# Patient Record
Sex: Female | Born: 1960
Health system: Southern US, Community
[De-identification: ages and names within clinical notes are randomized; demographics above are authoritative.]

## PROBLEM LIST (undated history)

## (undated) DIAGNOSIS — R011 Cardiac murmur, unspecified: Secondary | ICD-10-CM

## (undated) DIAGNOSIS — I10 Essential (primary) hypertension: Secondary | ICD-10-CM

## (undated) DIAGNOSIS — M722 Plantar fascial fibromatosis: Secondary | ICD-10-CM

## (undated) DIAGNOSIS — D649 Anemia, unspecified: Secondary | ICD-10-CM

## (undated) HISTORY — DX: Essential (primary) hypertension: I10

## (undated) HISTORY — PX: ABDOMINAL HYSTERECTOMY: SHX81

## (undated) SURGERY — Surgical Case
Anesthesia: *Unknown

---

## 1999-10-17 ENCOUNTER — Other Ambulatory Visit: Admission: RE | Admit: 1999-10-17 | Discharge: 1999-10-17 | Payer: Self-pay | Admitting: Obstetrics and Gynecology

## 2002-08-19 ENCOUNTER — Other Ambulatory Visit: Admission: RE | Admit: 2002-08-19 | Discharge: 2002-08-19 | Payer: Self-pay | Admitting: Obstetrics and Gynecology

## 2005-07-01 ENCOUNTER — Emergency Department (HOSPITAL_COMMUNITY): Admission: EM | Admit: 2005-07-01 | Discharge: 2005-07-02 | Payer: Self-pay | Admitting: Emergency Medicine

## 2007-01-09 ENCOUNTER — Ambulatory Visit (HOSPITAL_COMMUNITY): Admission: RE | Admit: 2007-01-09 | Discharge: 2007-01-09 | Payer: Self-pay | Admitting: Obstetrics and Gynecology

## 2007-07-04 ENCOUNTER — Inpatient Hospital Stay (HOSPITAL_COMMUNITY): Admission: EM | Admit: 2007-07-04 | Discharge: 2007-07-06 | Payer: Self-pay | Admitting: *Deleted

## 2007-08-03 ENCOUNTER — Inpatient Hospital Stay (HOSPITAL_COMMUNITY): Admission: AD | Admit: 2007-08-03 | Discharge: 2007-08-03 | Payer: Self-pay | Admitting: Obstetrics and Gynecology

## 2008-03-08 ENCOUNTER — Emergency Department (HOSPITAL_COMMUNITY): Admission: EM | Admit: 2008-03-08 | Discharge: 2008-03-08 | Payer: Self-pay | Admitting: Emergency Medicine

## 2008-11-21 IMAGING — CR DG CHEST 2V
3 series · 3 of 3 positions shown · non-contrast
Comparison: none

CLINICAL DATA: Fainting and shortness of breath. 
 CHEST - 2 VIEW:

[w chest pa *]
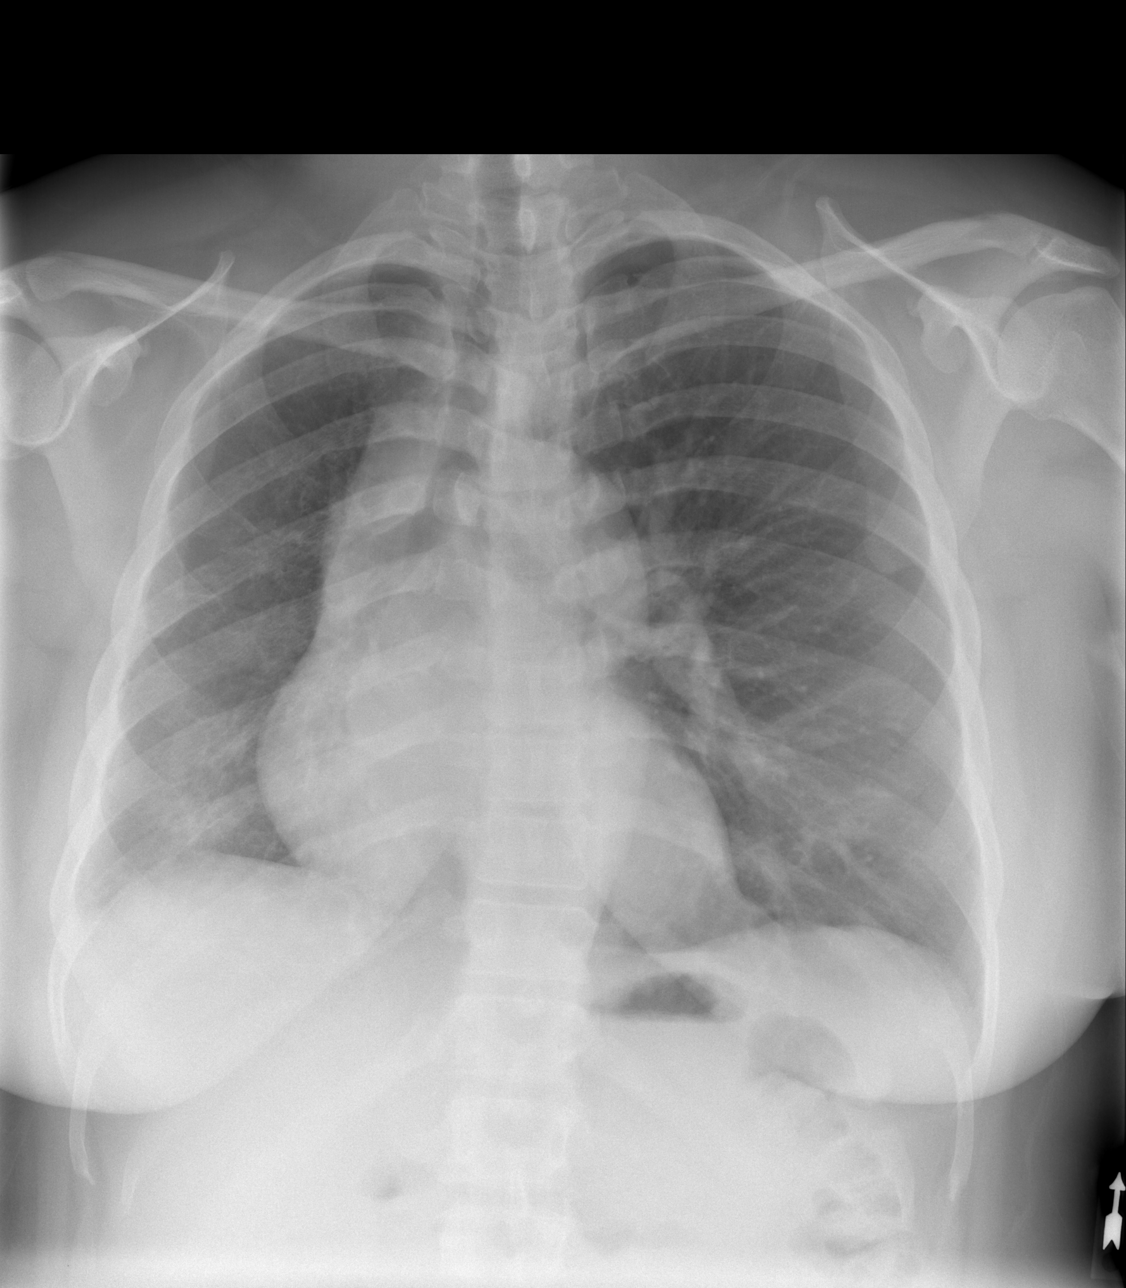

[w chest pa]
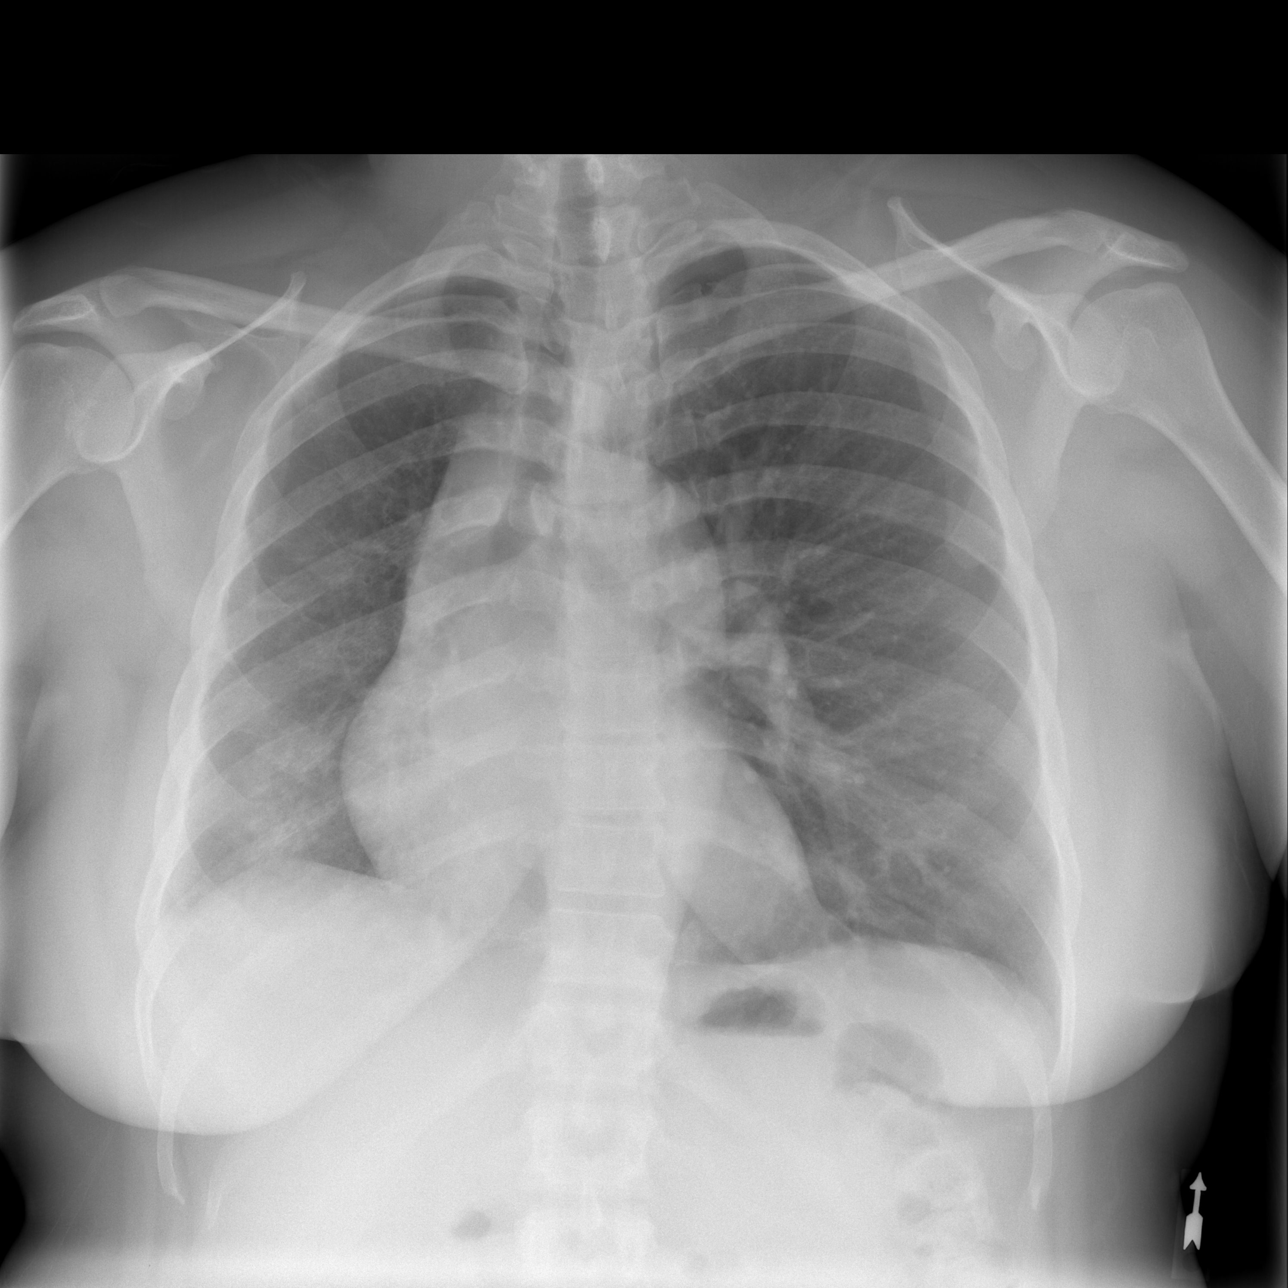

[w chest lat]
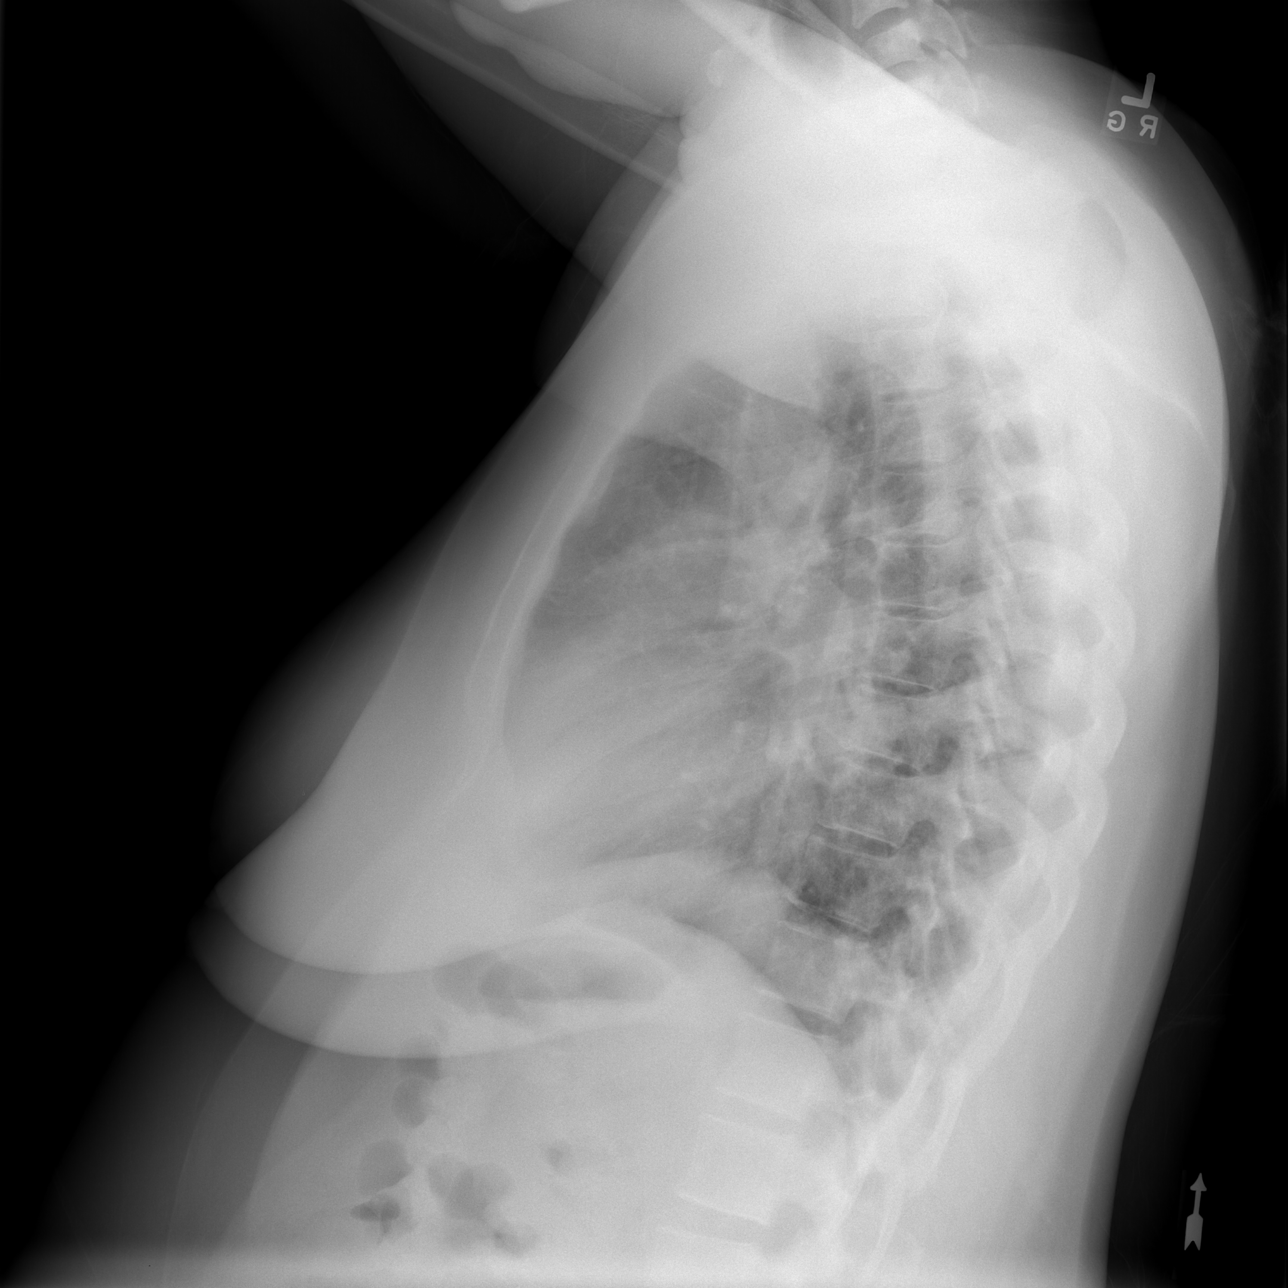

[3 of 3 positions shown; findings below may reference images not displayed]

FINDINGS: Normal mediastinum and cardiac silhouette with ectatic aorta. There is vague airspace disease projecting over the right hemidiaphragm. Left costophrenic angle is clear. There is mild prominence of the central pulmonary vasculature. No evidence of effusion. No pneumothorax.
IMPRESSION: 1.  Vague airspace disease in the right lower lung likely represents early pneumonia. 
 2.  Mild central venous pulmonary congestion.

## 2011-02-13 NOTE — H&P (Signed)
[name], Montgomery NO.:  [id number]   MEDICAL RECORD NO.:  [id number]          PATIENT TYPE:  MAT   LOCATION:  [facility]                          FACILITY:  [facility]   PHYSICIAN:  [name], M.D.DATE OF BIRTH:  1960-12-28   DATE OF ADMISSION:  08/03/2007  DATE OF DISCHARGE:                              HISTORY & PHYSICAL   HISTORY OF PRESENT ILLNESS:  [name] is a 50 year old female,  gravida 3, para 3-0-0-4, who presents to the maternity admissions area  at the [facility] complaining of vaginal bleeding.  The patient has a history of severe anemia, fibroids, and menorrhagia.  The patient was admitted in October 2008 and transfused because of  severe anemia and menorrhagia.  She is currently taking 3 birth control  pills each day to control her bleeding.  She reports that her bleeding  became slightly heavier on August 01, 2007.  She was frightened by the  bleeding and presented for evaluation.  She is currently scheduled for a  vaginal hysterectomy later this month.  The patient has a long history  of anemia, with a hemoglobin as low as five.  Her most recent hemoglobin  was 8.4.   PAST MEDICAL HISTORY:  1. The patient has a history of asthma.  2. She also has a history of one prior cesarean section.   CURRENT MEDICATIONS:  Iron, folic acid, multivitamins, and 3 oral  contraceptives each day.   DRUG ALLERGIES:  NO KNOWN DRUG ALLERGIES.   SOCIAL HISTORY:  The patient stopped smoking recently.  She drinks  alcohol socially.  She denies other recreational drug uses.   REVIEW OF SYSTEMS:  The patient denies dizziness at this point.   FAMILY HISTORY:  Her mother has hypertension, and her father has  diabetes.   PHYSICAL EXAMINATION:  VITAL SIGNS:  Blood pressure is 124/61, pulse is  85, respirations 20, temperature is 98.1.  HEENT:  Within normal limits.  CHEST:  Clear.  HEART:  Regular rate and rhythm.  ABDOMEN:  Soft and  nontender.  EXTREMITIES:  Grossly normal.  NEUROLOGIC:  Grossly normal.  PELVIC:  External genitalia is normal.  Vagina has only a small amount  of blood present.  The cervix is nontender.  There is a small amount of  blood present in the cervix.  The uterus is approximately 12-14 weeks  size and irregular.  Adnexa:  No masses are appreciated.   LABORATORY VALUES:  White blood cell count is 10,000, hemoglobin is 8.6,  hematocrit is 27.2, platelet count is 547.  Pregnancy test is negative.  Urinalysis is negative, except for a large amount of blood.  Her  chemistries are within normal limits.   ASSESSMENT:  1. History of menorrhagia.  2. History of anemia.  3. Fibroid uterus.   PLAN:  The patient is currently stable, and I do not see any reason that  we need to perform the patient's vaginal hysterectomy on an emergent  basis.  She will continue her 3 birth control pills each day, and she  will call should her bleeding increase.  We will continue with our plans  for surgery in November 2008, and we will move that surgery up if there  becomes an emergent reason to do so.      [name], M.D.  Electronically Signed     AVS/MEDQ  D:  08/03/2007  T:  08/04/2007  Job:  [id number]

## 2011-02-13 NOTE — Consult Note (Signed)
[name], [name]              ACCOUNT NO.:  [id number]   MEDICAL RECORD NO.:  [id number]          PATIENT TYPE:  INP   LOCATION:  1425                         FACILITY:  [facility]   PHYSICIAN:  Naima A. Dillard, M.D. DATE OF BIRTH:  Aug 06, 1961   DATE OF CONSULTATION:  07/05/2007  DATE OF DISCHARGE:                                 CONSULTATION   TIME SEEN:  2:55 p.m.   REASON FOR CONSULTATION:  Irregular vaginal bleeding and anemia.   FINDINGS:  The patient is a 50 year old, African-American female,  gravida 3, para 4, who presented with anemia and vaginal bleeding.  She  has had vaginal bleeding since May 27, 2007, where she has been  soaking through eight pads a day.  Her vaginal bleeding did decrease  with IV Estrogen given here in the hospital.  She had a hemoglobin of 5,  was transfused two units, which it did go up to 8.  She now denies any  shortness of breath, chest pain, or headaches.  She does still have a  scant vaginal bleeding.  She says that she has had heavy periods every  month lasting for seven days for over a year.  She has been seen in our  office by Dr. Stefano Gaul and has had an ultrasound and endometrial biopsy  and was preparing for a vaginal hysterectomy but lost her insurance, and  patient did not follow up with the [facility] Department or our office as  till this time.   PAST MEDICAL HISTORY:  Asthma.   PAST SURGICAL HISTORY:  C-section x1.   MEDICATIONS:  Zithromax, Rocephin, and Estrogen.   SOCIAL HISTORY:  She quit tobacco about six days ago, she drinks  moderate alcohol, and no illicit drug use.   FAMILY HISTORY:  She has no GYN cancer; her mother with chronic  hypertension and her dad with diabetes.   REVIEW OF SYSTEMS:  RESPIRATORY:  Significant for asthma and pneumonia.  GENITOURINARY:  Significant for menometrorrhagia.  PSYCHIATRIC:  Unremarkable.  ENDOCRINE:  Unremarkable.   PHYSICAL EXAMINATION:  VITAL SIGNS:  The patient is afebrile with  stable  vital signs.  HEART:  Regular rate and rhythm.  LUNGS:  Clear to auscultation bilaterally.  ABDOMEN:  Nondistended, soft and nontender, with no rebound tenderness  or guarding.  GENITOURINARY:  Scant vaginal bleeding, no cervical or vaginal lesions,  vulva was also within normal limits, uterus was about 10 to 12 weeks  size and irregular with no adnexal masses palpated bilaterally.  EXTREMITIES:  The legs have no cyanosis, clubbing, or edema.   PERTINENT LABORATORY DATA:  Her hemoglobin was 8.2.  Her urine pregnancy  test was negative.   ASSESSMENT:  1. Menometrorrhagia.  2. Anemia.  3. Uterine fibroids.  4. Pneumonia.   PLAN:  I agree with the IV Estrogen every four hours, but it should be  stopped once the bleeding stops.  The patient should be started on a low  dose birth control pill such as Alesse or LoEstrin 24 one tablet each  day.  The patient can call our office at [phone] or [facility]  at  [phone] for an appointment as soon as possible to get the patient ready  for a hysterectomy if this is what she so chooses.  The patient may have  to go to [facility] because she does not have insurance, and they  will allow her to pay on a sliding scale.  I also recommend a Social  Work consult till she is able to have help paying for her healthcare.  I  recommend iron sulfate 325 mg twice a day with Colace as needed.  Would  only recommend a hysterectomy once her hemoglobin has stabilized and her  pneumonia resolves.  Patient was offered Lupron, which she has had in  the past and decided to decline it today because of the side effects.  All treatments for fibroids were reviewed in detail, which are but not  limited to observation, all different forms of birth control, Lupron,  myomectomy, uterine embolization, and a hysterectomy.  The patient  states that she wants to do a hysterectomy.  She understands that there  is a small risk of developing a DVT or PE on the  birth control pills,  but she states that she will not smoke and call with any shortness of  breath, chest pain, or leg pain or swelling.      Naima A. Normand Sloop, M.D.  Electronically Signed     NAD/MEDQ  D:  07/05/2007  T:  07/05/2007  Job:  [id number]

## 2011-02-13 NOTE — Discharge Summary (Signed)
NAMEROBERTO, HLAVATY              ACCOUNT NO.:  [id number]   MEDICAL RECORD NO.:  [id number]          PATIENT TYPE:  INP   LOCATION:  1425                         FACILITY:  [facility]   PHYSICIAN:  Beckey Rutter, MD  DATE OF BIRTH:  27-Oct-1960   DATE OF ADMISSION:  07/04/2007  DATE OF DISCHARGE:  07/06/2007                               DISCHARGE SUMMARY   CHIEF COMPLAINT OF PRESENTATION:  Shortness of breath.   HISTORY OF PRESENT ILLNESS:  This is a pleasant 50 year old female with  a history of uterine fibroids admitted for acute blood loss anemia which  was symptomatic.  During hospitalization, the patient given 3 units of  packed RBCs and she was given a steroid injection to help stop the  uterine bleeding.  Her uterine/vaginal bleeding now stopped and her  hemoglobin is stabilized.  Her last hemoglobin is 9.2.   During her hospitalization she was seen also by GYN in consultation by  Dr. Jaymes Graff who recommended the patient to start on low dose of  oral contraceptive pills especially now she stated she quit smoking.  The patient was supposed to follow up with Dr. Redmond Baseman group with the  phone number [phone] or [facility] at [phone].   The patient will be seen by social work before discharge to help  reevaluate her insurance problem as per family request.   DISCHARGE DIAGNOSES:  1. Acute blood loss anemia requiring 3 units of transfusion.  2. Excessive vaginal bleeding.  3. Uterine fibroid.  4. Evidence of pneumonia.   DISCHARGE MEDICATIONS:  1. Iron sulfate 325 mg t.i.d.  2. Folic acid 1 mg p.o. daily.  3. Alesse oral contraceptive pills daily as directed.  4. Zithromax 500 mg p.o. for 3 more days.   DISCHARGE/PLAN:  1. For her pneumonia, the patient will continue on her Zithromax.  2. For the uterine bleeding/vaginal bleeding, she will be continued on      oral contraceptive pill and follow up GYN as discussed with her.   CONSULTATION:  The patient was  seen in consultation by Dr. Normand Sloop.     Beckey Rutter, MD  Electronically Signed    EME/MEDQ  D:  07/06/2007  T:  07/07/2007  Job:  [id number]

## 2011-02-13 NOTE — H&P (Signed)
[name], [name]              ACCOUNT NO.:  [id number]   MEDICAL RECORD NO.:  [id number]          PATIENT TYPE:  INP   LOCATION:  1425                         FACILITY:  [facility]   PHYSICIAN:  Della Goo, M.D. DATE OF BIRTH:  [birth year]   DATE OF ADMISSION:  07/04/2007  DATE OF DISCHARGE:                              HISTORY & PHYSICAL   PRIMARY CARE PHYSICIAN:  Unassigned.   HISTORY OF PRESENT ILLNESS:  This is a 50 year old female who presented  to the emergency department secondary to severe weakness and continued  vaginal bleeding which she reports having for approximately 2 months.  She has a history of fibroids and had undergone an evaluation for  possible hysterectomy, however, reports losing her insurance and did not  continue her followup.  The patient reports having symptoms of increased  weakness and presyncope for approximately 1 week and she reports that  these symptoms were worse today.  She reports being short of breath, but  denies having chest pain.  She denies having any fevers or chills.  In  the emergency department, the patient had lab work performed, results of  which reveal a hemoglobin level of 6.1 and the patient was referred for  admission.   PAST MEDICAL HISTORY:  History of asthma.   PAST SURGICAL HISTORY:  C-section x1.   MEDICATIONS:  None.   ALLERGIES:  NO KNOWN DRUG ALLERGIES.   SOCIAL HISTORY:  The patient is married.  History of tobacco usage and  history of occasional alcohol usage.  The patient denies having any  illicit drug usage.   FAMILY HISTORY:  Positive for hypertension in her mother.  Positive for  type 2 diabetes in her father.   REVIEW OF SYSTEMS:  Pertinents are mentioned above.   PHYSICAL EXAMINATION:  GENERAL:  This is an obese 50 year old female  with stable vital signs, who is in no acute distress, but is in  discomfort.  VITAL SIGNS:  Temperature 98, blood pressure 135/73, heart  rate 90, respirations 20, O2  saturations 100% on room air.  HEENT:  Normocephalic, atraumatic.  There is no scleral icterus.  Pupils are  equally round and reactive to light.  Extraocular muscles are intact.  Funduscopic benign.  Oropharynx is clear.  NECK:  Supple.  Full range of motion.  No thyromegaly, adenopathy or  jugular venous distention.  CARDIOVASCULAR:  Regular rate and rhythm.  No murmurs, gallops or rubs.  LUNGS:  Clear to auscultation bilaterally.  ABDOMEN:  Positive bowel sounds.  Soft, nontender, nondistended.  EXTREMITIES:  Without cyanosis or clubbing.  There is trace edema.  NEUROLOGIC:  The patient does have mild generalized weakness.  She is  alert and oriented x3.  There are no focal deficits.   LABORATORY STUDIES:  White count 7.6, hemoglobin 6.1, hematocrit 20.3,  platelets 503,000, neutrophils 51%, lymphocytes 37%.  Sodium 138,  potassium 3.6, chloride 106, CO2 26, BUN 9, creatinine 0.68 and glucose  89.  Urine pregnancy test negative.  Urine drug screen negative.  Urinalysis negative.  Pro time 13, INR 1, PTT 28.   Chest x-ray findings revealed  vague airspace disease and right lower  lung/early pneumonia and mild central venous pulmonary congestion.   Review of the medical records and radiologic reports reveals the patient  underwent a CT scan of the pelvis, October 2006, which did reveal an  enlarged fibroid uterus.   ASSESSMENT:  A 50 year old female being admitted with:  1. Anemia.  2. Vaginal bleeding.  3. Uterine fibroids.  4. Pneumonia, right lower lobe.  5. Obesity.  6. Tobacco abuse.   PLAN:  The patient will be admitted and will be transfused 2 units of  packed red blood cells.  Her hemoglobin levels will be monitored.  One  unit of packed red cells will be placed on hold in case of a need for  further transfusion.  IV fluids have been ordered as well.  The patient  will be placed on therapy for the vaginal bleeding with IV estrogen.  A  GYN consultation will be placed.   The patient also will receive  antibiotic therapy for pneumonia of Rocephin and azithromycin.  The  patient will also be placed on nebulizer treatments and supplemental  oxygen p.r.n.  The patient will be placed on GI prophylaxis and TED hose  will be ordered for DVT prophylaxis      Della Goo, M.D.  Electronically Signed     HJ/MEDQ  D:  07/05/2007  T:  07/07/2007  Job:  [id number]   cc:   Janine Limbo, M.D.  Fax: [phone]

## 2011-07-10 LAB — BASIC METABOLIC PANEL
BUN: 5 — ABNORMAL LOW
CO2: 27
Calcium: 8.5
Chloride: 106
Creatinine, Ser: 0.64
GFR calc Af Amer: >60
GFR calc non Af Amer: >60
Glucose, Bld: 131 — ABNORMAL HIGH
Potassium: 3.3 — ABNORMAL LOW
Sodium: 137

## 2011-07-10 LAB — URINALYSIS, ROUTINE W REFLEX MICROSCOPIC
Glucose, UA: NEGATIVE
Ketones, ur: NEGATIVE
Leukocytes, UA: NEGATIVE
Nitrite: NEGATIVE
Protein, ur: 30 — AB
Specific Gravity, Urine: >1.03 — ABNORMAL HIGH
Urobilinogen, UA: 0.2
pH: 6

## 2011-07-10 LAB — CBC
HCT: 27.2 — ABNORMAL LOW
Hemoglobin: 8.6 — ABNORMAL LOW
MCHC: 31.6
MCV: 67.6 — ABNORMAL LOW
Platelets: 547 — ABNORMAL HIGH
RBC: 4.03
RDW: 24.5 — ABNORMAL HIGH
WBC: 10

## 2011-07-10 LAB — POCT PREGNANCY, URINE
Operator id: 11741
Operator id: 117411
Preg Test, Ur: NEGATIVE
Preg Test, Ur: NEGATIVE

## 2011-07-10 LAB — TYPE AND SCREEN
ABO/RH(D): O POS
Antibody Screen: NEGATIVE

## 2011-07-10 LAB — URINE MICROSCOPIC-ADD ON

## 2011-07-12 LAB — DIFFERENTIAL
Basophils Absolute: 0.1
Basophils Relative: 1
Eosinophils Absolute: 0.2
Eosinophils Relative: 3
Lymphocytes Relative: 37
Lymphs Abs: 2.8
Monocytes Absolute: 0.6
Monocytes Relative: 8
Neutro Abs: 3.9
Neutrophils Relative %: 51

## 2011-07-12 LAB — BASIC METABOLIC PANEL
BUN: 6
Creatinine, Ser: 0.67
GFR calc non Af Amer: >60

## 2011-07-12 LAB — CBC
HCT: 20.3 — ABNORMAL LOW
Hemoglobin: 6.1 — CL
MCHC: 30
MCV: 65 — ABNORMAL LOW
Platelets: 503 — ABNORMAL HIGH
RBC: 3.12 — ABNORMAL LOW
RDW: 20.9 — ABNORMAL HIGH
WBC: 7.6

## 2011-07-12 LAB — CROSSMATCH
ABO/RH(D): O POS
Antibody Screen: NEGATIVE

## 2011-07-12 LAB — TSH: TSH: 4.222

## 2011-07-12 LAB — URINALYSIS, ROUTINE W REFLEX MICROSCOPIC
Bilirubin Urine: NEGATIVE
Glucose, UA: NEGATIVE
Hgb urine dipstick: NEGATIVE
Ketones, ur: NEGATIVE
Nitrite: NEGATIVE
Protein, ur: NEGATIVE
Specific Gravity, Urine: 1.021
Urobilinogen, UA: 0.2
pH: 6

## 2011-07-12 LAB — PROTIME-INR
INR: 1
Prothrombin Time: 13

## 2011-07-12 LAB — COMPREHENSIVE METABOLIC PANEL
ALT: 16
AST: 18
Albumin: 3 — ABNORMAL LOW
Alkaline Phosphatase: 63
BUN: 9
CO2: 26
Calcium: 8.8
Chloride: 106
Creatinine, Ser: 0.68
GFR calc Af Amer: >60
GFR calc non Af Amer: >60
Glucose, Bld: 89
Potassium: 3.6
Sodium: 138
Total Bilirubin: 0.4
Total Protein: 6.8

## 2011-07-12 LAB — CULTURE, BLOOD (ROUTINE X 2)
Culture: NO GROWTH
Culture: NO GROWTH

## 2011-07-12 LAB — APTT: aPTT: 28

## 2011-07-12 LAB — RAPID URINE DRUG SCREEN, HOSP PERFORMED
Amphetamines: NOT DETECTED
Barbiturates: NOT DETECTED
Benzodiazepines: NOT DETECTED
Cocaine: NOT DETECTED
Opiates: NOT DETECTED
Tetrahydrocannabinol: NOT DETECTED

## 2011-07-12 LAB — HEMOGLOBIN AND HEMATOCRIT, BLOOD
HCT: 17.4 — ABNORMAL LOW
HCT: 28.5 — ABNORMAL LOW
Hemoglobin: 5.3 — CL
Hemoglobin: 8.9 — ABNORMAL LOW
Hemoglobin: 9.1 — ABNORMAL LOW
Hemoglobin: 9.2 — ABNORMAL LOW

## 2011-07-12 LAB — FERRITIN: Ferritin: 11 (ref 10–291)

## 2011-07-12 LAB — IRON AND TIBC
Iron: 18 — ABNORMAL LOW
UIBC: 372

## 2011-07-12 LAB — ABO/RH: ABO/RH(D): O POS

## 2011-07-12 LAB — PREGNANCY, URINE: Preg Test, Ur: NEGATIVE

## 2011-07-12 LAB — RETICULOCYTES: RBC.: 3.61 — ABNORMAL LOW

## 2011-07-12 LAB — PREPARE RBC (CROSSMATCH)

## 2011-08-08 ENCOUNTER — Emergency Department (HOSPITAL_COMMUNITY)
Admission: EM | Admit: 2011-08-08 | Discharge: 2011-08-08 | Discharge status: Against Medical Advice | Payer: Self-pay | Attending: Emergency Medicine | Admitting: Emergency Medicine

## 2011-08-08 DIAGNOSIS — R040 Epistaxis: Secondary | ICD-10-CM | POA: Insufficient documentation

## 2012-02-24 ENCOUNTER — Emergency Department (HOSPITAL_COMMUNITY)
Admission: EM | Admit: 2012-02-24 | Discharge: 2012-02-25 | Disposition: A | Discharge status: Home / Self Care | Payer: Self-pay | Attending: Emergency Medicine | Admitting: Emergency Medicine

## 2012-02-24 DIAGNOSIS — R55 Syncope and collapse: Secondary | ICD-10-CM | POA: Insufficient documentation

## 2012-02-24 DIAGNOSIS — R42 Dizziness and giddiness: Secondary | ICD-10-CM | POA: Insufficient documentation

## 2012-02-24 DIAGNOSIS — D500 Iron deficiency anemia secondary to blood loss (chronic): Secondary | ICD-10-CM | POA: Insufficient documentation

## 2012-02-24 DIAGNOSIS — N898 Other specified noninflammatory disorders of vagina: Secondary | ICD-10-CM | POA: Insufficient documentation

## 2012-02-24 HISTORY — DX: Anemia, unspecified: D64.9

## 2012-02-24 LAB — POCT I-STAT, CHEM 8
BUN: 5 mg/dL — ABNORMAL LOW (ref 6–23)
Calcium, Ion: 1.26 mmol/L (ref 1.12–1.32)
Chloride: 108 mEq/L (ref 96–112)
Creatinine, Ser: 0.8 mg/dL (ref 0.50–1.10)
Glucose, Bld: 97 mg/dL (ref 70–99)
HCT: 22 % — ABNORMAL LOW (ref 36.0–46.0)
Hemoglobin: 7.5 g/dL — ABNORMAL LOW (ref 12.0–15.0)
Potassium: 3.9 mEq/L (ref 3.5–5.1)
Sodium: 143 mEq/L (ref 135–145)
TCO2: 24 mmol/L (ref 0–100)

## 2012-02-24 LAB — DIFFERENTIAL
Basophils Absolute: 0.1 10*3/uL (ref 0.0–0.1)
Basophils Relative: 1 % (ref 0–1)
Eosinophils Absolute: 0.3 10*3/uL (ref 0.0–0.7)
Eosinophils Relative: 4 % (ref 0–5)
Lymphocytes Relative: 29 % (ref 12–46)
Lymphs Abs: 2.4 10*3/uL (ref 0.7–4.0)
Monocytes Absolute: 0.8 10*3/uL (ref 0.1–1.0)
Monocytes Relative: 9 % (ref 3–12)
Neutro Abs: 4.8 10*3/uL (ref 1.7–7.7)
Neutrophils Relative %: 57 % (ref 43–77)

## 2012-02-24 LAB — COMPREHENSIVE METABOLIC PANEL
ALT: 12 U/L (ref 0–35)
AST: 15 U/L (ref 0–37)
Albumin: 3.1 g/dL — ABNORMAL LOW (ref 3.5–5.2)
Alkaline Phosphatase: 58 U/L (ref 39–117)
BUN: 7 mg/dL (ref 6–23)
CO2: 23 mEq/L (ref 19–32)
Calcium: 9 mg/dL (ref 8.4–10.5)
Chloride: 106 mEq/L (ref 96–112)
Creatinine, Ser: 0.83 mg/dL (ref 0.50–1.10)
GFR calc Af Amer: >90 mL/min (ref >90)
GFR calc non Af Amer: 80 mL/min — ABNORMAL LOW (ref >90)
Glucose, Bld: 95 mg/dL (ref 70–99)
Potassium: 3.8 mEq/L (ref 3.5–5.1)
Sodium: 138 mEq/L (ref 135–145)
Total Bilirubin: 0.1 mg/dL — ABNORMAL LOW (ref 0.3–1.2)
Total Protein: 7 g/dL (ref 6.0–8.3)

## 2012-02-24 LAB — CBC
HCT: 21.2 % — ABNORMAL LOW (ref 36.0–46.0)
Hemoglobin: 5.6 g/dL — CL (ref 12.0–15.0)
MCH: 17.2 pg — ABNORMAL LOW (ref 26.0–34.0)
MCHC: 26.5 g/dL — ABNORMAL LOW (ref 30.0–36.0)
MCV: 64.7 fL — ABNORMAL LOW (ref 78.0–100.0)
Platelets: 543 10*3/uL — ABNORMAL HIGH (ref 150–400)
RBC: 3.26 MIL/uL — ABNORMAL LOW (ref 3.87–5.11)
RDW: 21.2 % — ABNORMAL HIGH (ref 11.5–15.5)
WBC: 8.4 10*3/uL (ref 4.0–10.5)

## 2012-02-24 LAB — PREPARE RBC (CROSSMATCH)

## 2012-02-24 MED ORDER — SODIUM CHLORIDE 0.9 % IV BOLUS (SEPSIS): 1000.0000 mL | Freq: Once | INTRAVENOUS | Status: DC

## 2012-02-24 MED ORDER — SODIUM CHLORIDE 0.9 % IV SOLN
1000.0000 mL | INTRAVENOUS | Status: DC
  Administered 2012-02-24: 1000 mL via INTRAVENOUS

## 2012-02-24 MED ORDER — ACETAMINOPHEN 325 MG PO TABS
650.0000 mg | ORAL_TABLET | Freq: Once | ORAL | Status: AC
  Administered 2012-02-24: 650 mg via ORAL
  Filled 2012-02-24: qty 2

## 2012-02-24 MED ORDER — SODIUM CHLORIDE 0.9 % IV SOLN
1000.0000 mL | Freq: Once | INTRAVENOUS | Status: AC
  Administered 2012-02-24: 1000 mL via INTRAVENOUS

## 2012-02-24 NOTE — ED Notes (Signed)
HGB 5

## 2012-02-24 NOTE — ED Notes (Signed)
Pt sts she has been bleeding vaginally x 20 days. Sts she got her blood drawn at a clinic on Friday and her Hemoglobin was 5.5. Sts she did not want to go to the hospital at that time so the clinic MD wrote her an Rx and told her to take iron pills. Patient has been taking folic acid in stead of iron due to misunderstanding. Patient also sts that she has been feeling extremely weak and tired lately and got dizzy in the shower this morning.

## 2012-02-24 NOTE — ED Provider Notes (Addendum)
History    50yF with vsycnope. Onset 20 days ago. Painless. Seen at [facility] and apparently noted to be anemic then but refused further evaluation. Prescribed iron pills and "hormone pills." Bleeding has actually seemed to stop today but pt had syncopal event which prompted to come to ED. Pt has had to catch herself several times today and yesterday to get her balance because she felt dizzy like she was going to pass out. Around 2000 today she did. Quick return to baseline. Doesn't think hit head. Denies pain anywhere. Hx of uterine fibroids. Required transfusion in 2008. Told that would need procedure but never got it.   CSN: [id number]  Arrival date & time 02/24/12  2032   First MD Initiated Contact with Patient 02/24/12 2114      Chief Complaint  Patient presents with  . Dizziness  . Loss of Consciousness  . Anemia    (Consider location/radiation/quality/duration/timing/severity/associated sxs/prior treatment) HPI  No past medical history on file.  No past surgical history on file.  No family history on file.  History  Substance Use Topics  . Smoking status: Not on file  . Smokeless tobacco: Not on file  . Alcohol Use: Not on file    OB History    No data available      Review of Systems   Review of symptoms negative unless otherwise noted in HPI.   Allergies  Review of patient's allergies indicates no known allergies.  Home Medications   Current Outpatient Rx  Name Route Sig Dispense Refill  . FOLIC ACID 400 MCG PO TABS Oral Take 400 mcg by mouth daily.    . IRON PO Oral Take 1 tablet by mouth daily.    Marland Kitchen MEDROXYPROGESTERONE ACETATE 10 MG PO TABS Oral Take 10 mg by mouth daily.      BP 97/53  Pulse 76  Temp 98.1 F (36.7 C)  Resp 22  SpO2 100%  Physical Exam  Nursing note and vitals reviewed. Constitutional: She appears well-developed and well-nourished. No distress.  HENT:  Head: Normocephalic and atraumatic.  Eyes: Conjunctivae are normal. Right eye  exhibits no discharge. Left eye exhibits no discharge.  Neck: Neck supple.  Cardiovascular: Normal rate, regular rhythm and normal heart sounds.  Exam reveals no gallop and no friction rub.   No murmur heard. Pulmonary/Chest: Effort normal and breath sounds normal. No respiratory distress.  Abdominal: Soft. She exhibits no distension. There is no tenderness.  Genitourinary:       Normal external female genitalia. One small blood clot noted on tampon. No blood in vaginal vault. No blood from os.   Musculoskeletal: She exhibits no edema and no tenderness.  Neurological: She is alert.  Skin: Skin is warm and dry.  Psychiatric: She has a normal mood and affect. Her behavior is normal. Thought content normal.    ED Course  Procedures (including critical care time)  Labs Reviewed  CBC - Abnormal; Notable for the following:    RBC 3.26 (*)    Hemoglobin 5.6 (*)    HCT 21.2 (*)    MCV 64.7 (*)    MCH 17.2 (*)    MCHC 26.5 (*)    RDW 21.2 (*)    Platelets 543 (*)    All other components within normal limits  COMPREHENSIVE METABOLIC PANEL - Abnormal; Notable for the following:    Albumin 3.1 (*)    Total Bilirubin 0.1 (*)    GFR calc non Af Amer 80 (*)  All other components within normal limits  POCT I-STAT, CHEM 8 - Abnormal; Notable for the following:    BUN 5 (*)    Hemoglobin 7.5 (*)    HCT 22.0 (*)    All other components within normal limits  DIFFERENTIAL  TYPE AND SCREEN  PREPARE RBC (CROSSMATCH)  POCT CBG (FASTING - GLUCOSE)-MANUAL ENTRY   No results found.  EKG:  Rhythm: Normal sinus Rate: 74 Axis: Normal Intervals: normal ST segments: normal   1. Blood loss anemia   2. Syncope       MDM  50yF with symptomatic anemia from vaginal bleeding. Likely from uterine fibroids which has a history of. Labs. Pelvic. Likely tranfusion.   12:42 AM Pt reassessed. Has used bathroom twice now since her pelvic exam and has not noted any bleeding.  HD stable. Will  reassess after transfusion, but may be able to be discharged with close outpt gyn fu.    1:29 AM Discussed case with Dr Hyacinth Meeker in sign out. Pt receiving 1st of 2 units of blood. Pt would rather not stay for observation. I do not feel that this is unreasonable with no signs of continued bleeding assuming pt remains HD stable.   Raeford Razor, MD 02/25/12 [id number]  Raeford Razor, MD 02/25/12 0130

## 2012-02-24 NOTE — ED Notes (Signed)
Pt presents alert- family reports "she passed out" witnessed fall. Very dizzy

## 2012-02-25 ENCOUNTER — Encounter (HOSPITAL_COMMUNITY): Payer: Self-pay | Admitting: Emergency Medicine

## 2012-02-25 ENCOUNTER — Emergency Department (HOSPITAL_COMMUNITY)
Admission: EM | Admit: 2012-02-25 | Discharge: 2012-02-25 | Disposition: A | Discharge status: Home / Self Care | Payer: Self-pay | Attending: Emergency Medicine | Admitting: Emergency Medicine

## 2012-02-25 ENCOUNTER — Encounter (HOSPITAL_COMMUNITY): Payer: Self-pay | Admitting: *Deleted

## 2012-02-25 DIAGNOSIS — R109 Unspecified abdominal pain: Secondary | ICD-10-CM | POA: Insufficient documentation

## 2012-02-25 DIAGNOSIS — N939 Abnormal uterine and vaginal bleeding, unspecified: Secondary | ICD-10-CM

## 2012-02-25 DIAGNOSIS — J45909 Unspecified asthma, uncomplicated: Secondary | ICD-10-CM | POA: Insufficient documentation

## 2012-02-25 DIAGNOSIS — N898 Other specified noninflammatory disorders of vagina: Secondary | ICD-10-CM | POA: Insufficient documentation

## 2012-02-25 LAB — POCT I-STAT, CHEM 8
Calcium, Ion: 1.21 mmol/L (ref 1.12–1.32)
Glucose, Bld: 85 mg/dL (ref 70–99)
HCT: 26 % — ABNORMAL LOW (ref 36.0–46.0)
Hemoglobin: 8.8 g/dL — ABNORMAL LOW (ref 12.0–15.0)

## 2012-02-25 LAB — COMPREHENSIVE METABOLIC PANEL
ALT: 12 U/L (ref 0–35)
Alkaline Phosphatase: 58 U/L (ref 39–117)
CO2: 23 mEq/L (ref 19–32)
Chloride: 108 mEq/L (ref 96–112)
GFR calc Af Amer: >90 mL/min (ref >90)
GFR calc non Af Amer: 83 mL/min — ABNORMAL LOW (ref >90)
Glucose, Bld: 87 mg/dL (ref 70–99)
Potassium: 3.8 mEq/L (ref 3.5–5.1)
Sodium: 138 mEq/L (ref 135–145)
Total Bilirubin: 0.3 mg/dL (ref 0.3–1.2)

## 2012-02-25 LAB — CBC
Hemoglobin: 7.6 g/dL — ABNORMAL LOW (ref 12.0–15.0)
MCH: 20.8 pg — ABNORMAL LOW (ref 26.0–34.0)
RBC: 3.65 MIL/uL — ABNORMAL LOW (ref 3.87–5.11)

## 2012-02-25 LAB — DIFFERENTIAL
Basophils Relative: 0 % (ref 0–1)
Eosinophils Relative: 2 % (ref 0–5)
Lymphocytes Relative: 28 % (ref 12–46)
Monocytes Absolute: 0.7 10*3/uL (ref 0.1–1.0)
Neutrophils Relative %: 63 % (ref 43–77)

## 2012-02-25 MED ORDER — ONDANSETRON HCL 4 MG/2ML IJ SOLN
4.0000 mg | Freq: Once | INTRAMUSCULAR | Status: AC
  Administered 2012-02-25: 4 mg via INTRAVENOUS

## 2012-02-25 MED ORDER — FENTANYL CITRATE 0.05 MG/ML IJ SOLN
25.0000 ug | Freq: Once | INTRAMUSCULAR | Status: AC
  Administered 2012-02-25: 25 ug via INTRAVENOUS

## 2012-02-25 MED ORDER — FENTANYL CITRATE 0.05 MG/ML IJ SOLN
INTRAMUSCULAR | Status: AC
  Filled 2012-02-25: qty 2

## 2012-02-25 MED ORDER — ONDANSETRON HCL 4 MG/2ML IJ SOLN
INTRAMUSCULAR | Status: AC
  Filled 2012-02-25: qty 2

## 2012-02-25 MED ORDER — MEDROXYPROGESTERONE ACETATE 150 MG/ML IM SUSP
400.0000 mg | Freq: Once | INTRAMUSCULAR | Status: AC
  Administered 2012-02-25: 405 mg via INTRAMUSCULAR
  Filled 2012-02-25: qty 2.7

## 2012-02-25 NOTE — Discharge Instructions (Signed)
Anemia, Frequently Asked Questions WHAT ARE THE SYMPTOMS OF ANEMIA?  Headache.   Difficulty thinking.   Fatigue.   Shortness of breath.   Weakness.   Rapid heartbeat.  AT WHAT POINT ARE PEOPLE CONSIDERED ANEMIC?  This varies with gender and age.   Both hemoglobin (Hgb) and hematocrit values are used to define anemia. These lab values are obtained from a complete blood count (CBC) test. This is performed at a caregiver's office.   The normal range of hemoglobin values for adult men is 14.0 g/dL to 17.4 g/dL. For nonpregnant women, values are 12.3 g/dL to 15.3 g/dL.   The World Health Organization defines anemia as less than 12 g/dL for nonpregnant women and less than 13 g/dL for men.   For adult males, the average normal hematocrit is 46%, and the range is 40% to 52%.   For adult females, the average normal hematocrit is 41%, and the range is 35% to 47%.   Values that fall below the lower limits can be a sign of anemia and should have further checking (evaluation).  GROUPS OF PEOPLE WHO ARE AT RISK FOR DEVELOPING ANEMIA INCLUDE:   Infants who are breastfed or taking a formula that is not fortified with iron.   Children going through a rapid growth spurt. The iron available can not keep up with the needs for a red cell mass which must grow with the child.   Women in childbearing years. They need iron because of blood loss during menstruation.   Pregnant women. The growing fetus creates a high demand for iron.   People with ongoing gastrointestinal blood loss are at risk of developing iron deficiency.   Individuals with leukemia or cancer who must receive chemotherapy or radiation to treat their disease. The drugs or radiation used to treat these diseases often decreases the bone marrow's ability to make cells of all classes. This includes red blood cells, white blood cells, and platelets.   Individuals with chronic inflammatory conditions such as rheumatoid arthritis or  chronic infections.   The elderly.  ARE SOME TYPES OF ANEMIA INHERITED?   Yes, some types of anemia are due to inherited or genetic defects.   Sickle cell anemia. This occurs most often in people of African, African American, and Mediterranean descent.   Thalassemia (or Cooley's anemia). This type is found in people of Mediterranean and Southeast Asian descent. These types of anemia are common.   [place]. This is rare.  CAN CERTAIN MEDICATIONS CAUSE A PERSON TO BECOME ANEMIC?  Yes. For example, drugs to fight cancer (chemotherapeutic agents) often cause anemia. These drugs can slow the bone marrow's ability to make red blood cells. If there are not enough red blood cells, the body does not get enough oxygen. WHAT HEMATOCRIT LEVEL IS REQUIRED TO DONATE BLOOD?  The lower limit of an acceptable hematocrit for blood donors is 38%. If you have a low hematocrit value, you should schedule an appointment with your caregiver. ARE BLOOD TRANSFUSIONS COMMONLY USED TO CORRECT ANEMIA, AND ARE THEY DANGEROUS?  They are used to treat anemia as a last resort. Your caregiver will find the cause of the anemia and correct it if possible. Most blood transfusions are given because of excessive bleeding at the time of surgery, with trauma, or because of bone marrow suppression in patients with cancer or leukemia on chemotherapy. Blood transfusions are safer than ever before. We also know that blood transfusions affect the immune system and may increase certain risks. There is   also a concern for human error. In 1/16,000 transfusions, a patient receives a transfusion of blood that is not matched with his or her blood type.  WHAT IS IRON DEFICIENCY ANEMIA AND CAN I CORRECT IT BY CHANGING MY DIET?  Iron is an essential part of hemoglobin. Without enough hemoglobin, anemia develops and the body does not get the right amount of oxygen. Iron deficiency anemia develops after the body has had a low level of iron for a long  time. This is either caused by blood loss, not taking in or absorbing enough iron, or increased demands for iron (like pregnancy or rapid growth).  Foods from animal origin such as beef, chicken, and pork, are good sources of iron. Be sure to have one of these foods at each meal. Vitamin C helps your body absorb iron. Foods rich in Vitamin C include citrus, bell pepper, strawberries, spinach and cantaloupe. In some cases, iron supplements may be needed in order to correct the iron deficiency. In the case of poor absorption, extra iron may have to be given directly into the vein through a needle (intravenously). I HAVE BEEN DIAGNOSED WITH IRON DEFICIENCY ANEMIA AND MY CAREGIVER PRESCRIBED IRON SUPPLEMENTS. HOW LONG WILL IT TAKE FOR MY BLOOD TO BECOME NORMAL?  It depends on the degree of anemia at the beginning of treatment. Most people with mild to moderate iron deficiency, anemia will correct the anemia over a period of 2 to 3 months. But after the anemia is corrected, the iron stored by the body is still low. Caregivers often suggest an additional 6 months of oral iron therapy once the anemia has been reversed. This will help prevent the iron deficiency anemia from quickly happening again. Non-anemic adult males should take iron supplements only under the direction of a doctor, too much iron can cause liver damage.  MY HEMOGLOBIN IS 9 G/DL AND I AM SCHEDULED FOR SURGERY. SHOULD I POSTPONE THE SURGERY?  If you have Hgb of 9, you should discuss this with your caregiver right away. Many patients with similar hemoglobin levels have had surgery without problems. If minimal blood loss is expected for a minor procedure, no treatment may be necessary.  If a greater blood loss is expected for more extensive procedures, you should ask your caregiver about being treated with erythropoietin and iron. This is to accelerate the recovery of your hemoglobin to a normal level before surgery. An anemic patient who undergoes  high-blood-loss surgery has a greater risk of surgical complications and need for a blood transfusion, which also carries some risk.  I HAVE BEEN TOLD THAT HEAVY MENSTRUAL PERIODS CAUSE ANEMIA. IS THERE ANYTHING I CAN DO TO PREVENT THE ANEMIA?  Anemia that results from heavy periods is usually due to iron deficiency. You can try to meet the increased demands for iron caused by the heavy monthly blood loss by increasing the intake of iron-rich foods. Iron supplements may be required. Discuss your concerns with your caregiver. WHAT CAUSES ANEMIA DURING PREGNANCY?  Pregnancy places major demands on the body. The mother must meet the needs of both her body and her growing baby. The body needs enough iron and folate to make the right amount of red blood cells. To prevent anemia while pregnant, the mother should stay in close contact with her caregiver.  Be sure to eat a diet that has foods rich in iron and folate like liver and dark green leafy vegetables. Folate plays an important role in the normal development of a baby's   spinal cord. Folate can help prevent serious disorders like spina bifida. If your diet does not provide adequate nutrients, you may want to talk with your caregiver about nutritional supplements.  WHAT IS THE RELATIONSHIP BETWEEN FIBROID TUMORS AND ANEMIA IN WOMEN?  The relationship is usually caused by the increased menstrual blood loss caused by fibroids. Good iron intake may be required to prevent iron deficiency anemia from developing.  Document Released: 04/25/2004 Document Revised: 09/06/2011 Document Reviewed: 10/10/2010 [facility] Patient Information 2012 [place], [place].Syncope Syncope (fainting) is a sudden, short loss of consciousness. People normally fall to the ground when they faint. Recovery is often fast. HOME CARE  Do not drive or use machines. Wait until your doctor says it is safe to do so.   If you have diabetes, check your blood sugar. If it is low (below 70), you  need to drink or eat something sweet. If over 300, call your doctor.   If you have a blood pressure machine at home, take your blood pressure. If the top number is below 100 or above 170, call your doctor.   Lie down until you feel normal.   Drink extra fluids (water, juice, soup).  GET HELP RIGHT AWAY IF:   You pass out (faint) when sitting or lying down. Do not drive. Call your local emergency services (911 in [place] if no one is there to help you.   There is chest pain.   You feel sick to your stomach (nauseous) or keep throwing up (vomiting).   You have very bad belly (abdominal) pain.   You feel your heartbeat is fast or not normal.   You lose feeling in some part of the body.   You cannot move your arms or legs.   You cannot talk well and get confused.   You feel weak or cannot see well.   You get sweaty and feel lightheaded.  MAKE SURE YOU:   Understand these instructions.   Will watch your condition.   Will get help right away if you are not doing well or get worse.  Document Released: 03/05/2008 Document Revised: 09/06/2011 Document Reviewed: 03/05/2008 [facility] Patient Information 2012 [place], [place].  RESOURCE GUIDE  Dental Problems  Patients with Medicaid: [facility] [address]                                           [phone] [address] [employer] [address] Phone:  [phone]                                                  Phone:  [phone]  If unable to pay or uninsured, contact:  [facility] or [facility] to become qualified for the adult dental clinic.  Chronic Pain Problems Contact [facility]  [phone] Patients need to be referred by their primary care doctor.  Insufficient Money for Medicine Contact [employer]:  call "211" or [facility] [phone].  No Primary Care Doctor Call [facility]  [phone] Other agencies that provide inexpensive medical  care    [facility]  [phone]    [facility]  [phone]    [facility]  [phone]    [facility]  [phone]    [facility]  [phone]    [facility]  [phone]  Psychological Services [facility]  [phone] [facility]  [phone] [facility]   [phone] (emergency services [phone])  Substance Abuse Resources [facility]  [phone] [facility] 8150061354 [facility] [place] 5034704917 Floydene Flock [phone] Residential & Outpatient Substance Abuse Program  [phone]  Abuse/Neglect [facility] Child Abuse Hotline 581-242-7525 [facility] Child Abuse Hotline 312-486-7001 (After Hours)  Emergency Shelter [facility] [phone]  Maternity Homes Room at the [place] [facility] 2693824825 [facility] [phone]  MRSA Hotline #:   276-731-2559    [facility] Resources  [facility]     [employer]                          [facility] Dept. [address] [place]                       [address]      [address]  Blondell Reveal Phone:  [phone]                                   Phone:  [phone]                 Phone:  [phone]  [facility] Phone:  [phone]  [facility] Child Abuse Hotline 719-863-9577 [phone] (After Hours)

## 2012-02-25 NOTE — ED Provider Notes (Signed)
History     CSN: [id number]  Arrival date & time 02/25/12  1343   First MD Initiated Contact with Patient 02/25/12 1507      Chief Complaint  Patient presents with  . Vaginal Bleeding    (Consider location/radiation/quality/duration/timing/severity/associated sxs/prior treatment) HPI   Patient presents to the emergency department with complaints of vaginal bleeding. The patient has a past medical history of uterine fibroids with bleeding. She states that she's been bleeding for the past 23 days intermittently. Patient was seen in the emergency department yesterday after having a syncopal episode and was found to have a hemoglobin of 5.6. She was transfused with 2 units and discharged as the bleeding had stopped. She is asked to followup with Dr. Jean Rosenthal more gynecologist this week. She was also asked to return to the emergency department if her bleeding started again before.   the patient returns to the ER today as her bleeding has returned with some lower abdominal pain. Consistent with the pain yesterday. She admits to being a little bit dizzy but denies having any syncopal episodes today. She did hemodynamically stable, appears to be in no acute distress, is alert and oriented x3.  Pt has not seen a gynecologist since 2008. Past Medical History  Diagnosis Date  . Anemia   . Asthma     Past Surgical History  Procedure Date  . Cesarean section     Family History  Problem Relation Age of Onset  . Hypertension Mother   . Diabetes Father   . Hypertension Father   . Stroke Other     History  Substance Use Topics  . Smoking status: Passive Smoker    Types: Cigarettes  . Smokeless tobacco: Not on file  . Alcohol Use: Yes     socially    OB History    Grav Para Term Preterm Abortions TAB SAB Ect Mult Living                  Review of Systems   HEENT: denies blurry vision or change in hearing PULMONARY: Denies difficulty breathing and SOB CARDIAC: denies chest  pain or heart palpitations MUSCULOSKELETAL:  denies being unable to ambulate ABDOMEN AL: denies abdominal pain GU: denies loss of bowel or urinary control NEURO: denies numbness and tingling in extremities   Allergies  Review of patient's allergies indicates no known allergies.  Home Medications   Current Outpatient Rx  Name Route Sig Dispense Refill  . IRON PO Oral Take 1 tablet by mouth daily.    Marland Kitchen MEDROXYPROGESTERONE ACETATE 10 MG PO TABS Oral Take 10 mg by mouth daily.      BP 132/76  Pulse 70  Temp(Src) 98.3 F (36.8 C) (Oral)  Resp 18  SpO2 100%  LMP 02/03/2012  Physical Exam  Nursing note and vitals reviewed. Constitutional: She appears well-developed and well-nourished. No distress.  HENT:  Head: Normocephalic and atraumatic.  Eyes: Pupils are equal, round, and reactive to light.  Neck: Normal range of motion. Neck supple.  Cardiovascular: Normal rate and regular rhythm.   Pulmonary/Chest: Effort normal.  Abdominal: Soft. She exhibits no distension. There is tenderness (mild suprapubic pain). There is no rebound and no guarding.  Genitourinary: There is bleeding around the vagina.  Neurological: She is alert.  Skin: Skin is warm and dry.    ED Course  Procedures (including critical care time)  Labs Reviewed  CBC - Abnormal; Notable for the following:    RBC 3.65 (*)  Hemoglobin 7.6 (*)    HCT 26.3 (*)    MCV 72.1 (*)    MCH 20.8 (*)    MCHC 28.9 (*)    RDW 26.4 (*)    Platelets 438 (*)    All other components within normal limits  COMPREHENSIVE METABOLIC PANEL - Abnormal; Notable for the following:    Calcium 8.0 (*)    Albumin 2.9 (*)    GFR calc non Af Amer 83 (*)    All other components within normal limits  POCT I-STAT, CHEM 8 - Abnormal; Notable for the following:    Hemoglobin 8.8 (*)    HCT 26.0 (*)    All other components within normal limits  DIFFERENTIAL  GLUCOSE, CAPILLARY   No results found.   1. Vaginal bleeding        MDM  Patient hemoglobin found to be 7.6 in the ED. The bleeding has gradually stopped in ED as well. Patients pain controlled with pain medications.   I have consulted Dr. Tamela Oddi, Misty Stanley, who has advised to give 400 IM Depo-provera in ED. She is to call the Dr. Lodema Pilot office first thing in the ED to schedule a follow-up appointment.   I have discussed patient with plan. She is agreeable, understands and is thankful. I have answered all of her questions.  Pt has been advised of the symptoms that warrant their return to the ED. Patient has voiced understanding and has agreed to follow-up with the PCP or specialist.         Dorthula Matas, PA 02/25/12 1936  Dorthula Matas, PA 02/25/12 4540

## 2012-02-25 NOTE — ED Notes (Signed)
Patient given discharge instructions, information, prescriptions, and diet order. Patient states that they adequately understand discharge information given and to return to ED if symptoms return or worsen.     

## 2012-02-25 NOTE — ED Notes (Signed)
Pt seen here last pm for vaginal bleeding due to fibroid tumors. Pt received 2 units PRBC's last pm. Bleeding began again this am.

## 2012-02-25 NOTE — Discharge Instructions (Signed)

## 2012-02-25 NOTE — ED Notes (Signed)
Pt called at home and notified of the additional dose of Depo-Provera injection that was not given prior to leaving due to the transport of the medication from [facility].  Pt is unable to return to the ED tonight due to transportation issues.  Pt is to see OB/GYN tomorrow.  Pt verbalizes that she will inform her MD that she will need the additional dose of Depo-Provera.  Charge nurse and primary RN nurse notified.

## 2012-02-26 LAB — TYPE AND SCREEN
ABO/RH(D): O POS
Antibody Screen: POSITIVE
DAT, IgG: NEGATIVE
Unit division: 0
Unit division: 0

## 2012-02-27 NOTE — ED Provider Notes (Signed)
Medical screening examination/treatment/procedure(s) were performed by non-physician practitioner and as supervising physician I was immediately available for consultation/collaboration.  Shyne Resch R. Harris Kistler, MD 02/27/12 0741 

## 2012-03-12 ENCOUNTER — Other Ambulatory Visit: Payer: Self-pay | Admitting: Obstetrics & Gynecology

## 2012-04-25 ENCOUNTER — Other Ambulatory Visit: Payer: Self-pay | Admitting: Obstetrics & Gynecology

## 2012-04-25 DIAGNOSIS — Z1231 Encounter for screening mammogram for malignant neoplasm of breast: Secondary | ICD-10-CM

## 2012-05-05 ENCOUNTER — Encounter (HOSPITAL_COMMUNITY): Payer: Self-pay

## 2012-05-08 NOTE — Patient Instructions (Addendum)
[name]  05/08/2012   Your procedure is scheduled on:  05/16/12 0730am-1030am  Report to [facility] Stay [facility] at 0530 AM.  Call this number if you have problems the morning of surgery: [phone]   Remember:   Do not eat food:After Midnight.  May have clear liquids:until Midnight .    Take these medicines the morning of surgery with A SIP OF WATER:    Do not wear jewelry, make-up or nail polish.  Do not wear lotions, powders, or perfumes.   Do not shave 48 hours prior to surgery.   Do not bring valuables to the hospital.  Contacts, dentures or bridgework may not be worn into surgery.  Leave suitcase in the car. After surgery it may be brought to your room.  For patients admitted to the hospital, checkout time is 11:00 AM the day of discharge.  Marland Kitchen  Special Instructions: CHG Shower Use Special Wash: 1/2 bottle night before surgery and 1/2 bottle morning of surgery. shower chin to toes with CHG.  Wash face and private parts with regular soap.    Please read over the following fact sheets that you were given: MRSA Information, coughing and deep breathing exercises, leg exercises

## 2012-05-09 ENCOUNTER — Encounter (HOSPITAL_COMMUNITY)
Admission: RE | Admit: 2012-05-09 | Discharge: 2012-05-09 | Disposition: A | Discharge status: Home / Self Care | Payer: BC Managed Care – PPO | Source: Ambulatory Visit | Attending: Obstetrics & Gynecology | Admitting: Obstetrics & Gynecology

## 2012-05-09 ENCOUNTER — Encounter (HOSPITAL_COMMUNITY): Payer: Self-pay

## 2012-05-09 HISTORY — DX: Cardiac murmur, unspecified: R01.1

## 2012-05-09 LAB — CBC
HCT: 34.8 % — ABNORMAL LOW (ref 36.0–46.0)
MCHC: 30.2 g/dL (ref 30.0–36.0)
RDW: 20.1 % — ABNORMAL HIGH (ref 11.5–15.5)

## 2012-05-09 LAB — SURGICAL PCR SCREEN: Staphylococcus aureus: NEGATIVE

## 2012-05-14 ENCOUNTER — Ambulatory Visit (HOSPITAL_COMMUNITY)
Admission: RE | Admit: 2012-05-14 | Discharge: 2012-05-14 | Disposition: A | Discharge status: Home / Self Care | Payer: BC Managed Care – PPO | Source: Ambulatory Visit | Attending: Obstetrics & Gynecology | Admitting: Obstetrics & Gynecology

## 2012-05-14 DIAGNOSIS — Z1231 Encounter for screening mammogram for malignant neoplasm of breast: Secondary | ICD-10-CM

## 2012-05-14 NOTE — H&P (Signed)
  Subjective:  [name] is a 51 y.o.  female.  She presented to the office with abnormal uterine bleeding.  Onset of symptoms was gradual starting several years ago with rapidly worsening course since over the last several months. Bleeding is characterized as heavy. Length of bleeding: 7 day.  Associated symptoms include pelvic pain.  Evaluation to date: pelvic ultrasound: positive for uterine fibroids. The uterine sagital diameter is appromimately 11 cm.. Treatment to date: depo lupron therapy and iron therapy for anemia  Pertinent Gyn History Menses heavy Bleeding: abnormal uterine bleeding Preventive screening:  Last mammogram: normal Date: '07 Last pap: normal Date: 5/13   Past Medical History  Diagnosis Date  . Anemia   . Asthma     no problems in years   . Heart murmur     as a child     Past Surgical History  Procedure Date  . Cesarean section     No prescriptions prior to admission   No Known Allergies  History  Substance Use Topics  . Smoking status: Passive Smoker    Types: Cigarettes  . Smokeless tobacco: Not on file  . Alcohol Use: Yes     socially    Family History  Problem Relation Age of Onset  . Hypertension Mother   . Diabetes Father   . Hypertension Father   . Stroke Other      Review of Systems Pertinent items are noted in HPI.    Objective:   Vital signs in last 24 hours:    General:   alert  Skin:   normal  HEENT:  PERRLA  Lungs:   clear to auscultation bilaterally  Heart:   regular rate and rhythm, S1, S2 normal, no murmur, click, rub or gallop  Breasts:   normal without suspicious masses, skin or nipple changes or axillary nodes  Abdomen:  soft, non-tender; bowel sounds normal; no masses,  no organomegaly  Pelvis:  External genitalia: normal general appearance Vaginal: normal mucosa without prolapse or lesions Cervix: normal appearance Adnexa: non palpable Uterus: enlarged                                      Assessment/Plan:  Perimenopausal with continued, irregular bleeding.   Uterine fibroids.   A TRH with bilateral salpingectomies is planned.  I had a lengthy discussion with the patient regarding her bleeding and consideration for endometrial ablation versus hysterectomy.  Procedure, risks, reasons, benefits and complications (including injury to bowel, bladder, major blood vessel, ureter, bleeding, possibility of transfusion, infection, or fistula formation) were reviewed in detail. Consent was signed and preop testing was ordered.  Instructions were reviewed, including NPO after midnight.

## 2012-05-16 ENCOUNTER — Encounter (HOSPITAL_COMMUNITY): Payer: Self-pay | Admitting: Certified Registered Nurse Anesthetist

## 2012-05-16 ENCOUNTER — Ambulatory Visit (HOSPITAL_COMMUNITY)
Admission: RE | Admit: 2012-05-16 | Discharge: 2012-05-17 | Disposition: A | Discharge status: Home / Self Care | Payer: BC Managed Care – PPO | Source: Ambulatory Visit | Attending: Obstetrics & Gynecology | Admitting: Obstetrics & Gynecology

## 2012-05-16 ENCOUNTER — Encounter (HOSPITAL_COMMUNITY): Payer: Self-pay | Admitting: *Deleted

## 2012-05-16 ENCOUNTER — Encounter (HOSPITAL_COMMUNITY)
Admission: RE | Disposition: A | Discharge status: Home / Self Care | Payer: Self-pay | Source: Ambulatory Visit | Attending: Obstetrics & Gynecology

## 2012-05-16 ENCOUNTER — Ambulatory Visit (HOSPITAL_COMMUNITY): Payer: BC Managed Care – PPO | Admitting: Certified Registered Nurse Anesthetist

## 2012-05-16 DIAGNOSIS — D649 Anemia, unspecified: Secondary | ICD-10-CM | POA: Insufficient documentation

## 2012-05-16 DIAGNOSIS — D259 Leiomyoma of uterus, unspecified: Secondary | ICD-10-CM | POA: Diagnosis present

## 2012-05-16 DIAGNOSIS — N938 Other specified abnormal uterine and vaginal bleeding: Secondary | ICD-10-CM | POA: Insufficient documentation

## 2012-05-16 DIAGNOSIS — N852 Hypertrophy of uterus: Secondary | ICD-10-CM | POA: Insufficient documentation

## 2012-05-16 DIAGNOSIS — Z01812 Encounter for preprocedural laboratory examination: Secondary | ICD-10-CM | POA: Insufficient documentation

## 2012-05-16 DIAGNOSIS — N949 Unspecified condition associated with female genital organs and menstrual cycle: Secondary | ICD-10-CM | POA: Insufficient documentation

## 2012-05-16 LAB — HCG, SERUM, QUALITATIVE: Preg, Serum: NEGATIVE

## 2012-05-16 SURGERY — ROBOTIC ASSISTED TOTAL HYSTERECTOMY
Anesthesia: General | Site: Abdomen | Wound class: Clean Contaminated

## 2012-05-16 MED ORDER — ONDANSETRON HCL 4 MG/2ML IJ SOLN
INTRAMUSCULAR | Status: DC | PRN
  Administered 2012-05-16: 4 mg via INTRAVENOUS

## 2012-05-16 MED ORDER — GLYCOPYRROLATE 0.2 MG/ML IJ SOLN
INTRAMUSCULAR | Status: DC | PRN
  Administered 2012-05-16: 0.6 mg via INTRAVENOUS

## 2012-05-16 MED ORDER — KCL IN DEXTROSE-NACL 20-5-0.45 MEQ/L-%-% IV SOLN
INTRAVENOUS | Status: DC
  Administered 2012-05-16 – 2012-05-17 (×2): via INTRAVENOUS
  Filled 2012-05-16 (×5): qty 1000

## 2012-05-16 MED ORDER — ACETAMINOPHEN 10 MG/ML IV SOLN
INTRAVENOUS | Status: AC
  Filled 2012-05-16: qty 100

## 2012-05-16 MED ORDER — BUPIVACAINE HCL (PF) 0.25 % IJ SOLN
INTRAMUSCULAR | Status: DC | PRN
  Administered 2012-05-16: 10 mL

## 2012-05-16 MED ORDER — CEFAZOLIN SODIUM-DEXTROSE 2-3 GM-% IV SOLR
2.0000 g | INTRAVENOUS | Status: AC
  Administered 2012-05-16: 2 g via INTRAVENOUS

## 2012-05-16 MED ORDER — LACTATED RINGERS IV SOLN
INTRAVENOUS | Status: DC | PRN
  Administered 2012-05-16 (×2): via INTRAVENOUS

## 2012-05-16 MED ORDER — ESMOLOL HCL 10 MG/ML IV SOLN: INTRAVENOUS | Status: DC | PRN

## 2012-05-16 MED ORDER — ROCURONIUM BROMIDE 100 MG/10ML IV SOLN
INTRAVENOUS | Status: DC | PRN
  Administered 2012-05-16 (×3): 10 mg via INTRAVENOUS
  Administered 2012-05-16: 40 mg via INTRAVENOUS
  Administered 2012-05-16: 10 mg via INTRAVENOUS

## 2012-05-16 MED ORDER — CEFAZOLIN SODIUM-DEXTROSE 2-3 GM-% IV SOLR
INTRAVENOUS | Status: AC
  Filled 2012-05-16: qty 50

## 2012-05-16 MED ORDER — LIDOCAINE HCL (CARDIAC) 20 MG/ML IV SOLN
INTRAVENOUS | Status: DC | PRN
  Administered 2012-05-16: 100 mg via INTRAVENOUS

## 2012-05-16 MED ORDER — DEXAMETHASONE SODIUM PHOSPHATE 10 MG/ML IJ SOLN
INTRAMUSCULAR | Status: DC | PRN
  Administered 2012-05-16: 10 mg via INTRAVENOUS

## 2012-05-16 MED ORDER — MIDAZOLAM HCL 5 MG/5ML IJ SOLN
INTRAMUSCULAR | Status: DC | PRN
  Administered 2012-05-16: 2 mg via INTRAVENOUS

## 2012-05-16 MED ORDER — KETOROLAC TROMETHAMINE 30 MG/ML IJ SOLN
INTRAMUSCULAR | Status: AC
  Filled 2012-05-16: qty 1

## 2012-05-16 MED ORDER — KETOROLAC TROMETHAMINE 30 MG/ML IJ SOLN
30.0000 mg | Freq: Four times a day (QID) | INTRAMUSCULAR | Status: AC
  Filled 2012-05-16: qty 1

## 2012-05-16 MED ORDER — LABETALOL HCL 5 MG/ML IV SOLN
INTRAVENOUS | Status: DC | PRN
  Administered 2012-05-16: 5 mg via INTRAVENOUS

## 2012-05-16 MED ORDER — ZOLPIDEM TARTRATE 5 MG PO TABS: 5.0000 mg | ORAL_TABLET | Freq: Every evening | ORAL | Status: DC | PRN

## 2012-05-16 MED ORDER — KETOROLAC TROMETHAMINE 30 MG/ML IJ SOLN
30.0000 mg | Freq: Four times a day (QID) | INTRAMUSCULAR | Status: AC
  Administered 2012-05-16 – 2012-05-17 (×3): 30 mg via INTRAVENOUS
  Filled 2012-05-16: qty 1

## 2012-05-16 MED ORDER — BUPIVACAINE HCL (PF) 0.25 % IJ SOLN
INTRAMUSCULAR | Status: AC
  Filled 2012-05-16: qty 30

## 2012-05-16 MED ORDER — OXYCODONE-ACETAMINOPHEN 5-325 MG PO TABS
1.0000 | ORAL_TABLET | ORAL | Status: DC | PRN
  Administered 2012-05-17 (×2): 2 via ORAL
  Filled 2012-05-16 (×2): qty 2

## 2012-05-16 MED ORDER — ONDANSETRON HCL 4 MG/2ML IJ SOLN
4.0000 mg | Freq: Four times a day (QID) | INTRAMUSCULAR | Status: DC | PRN
  Administered 2012-05-16 (×2): 4 mg via INTRAVENOUS
  Filled 2012-05-16 (×2): qty 2

## 2012-05-16 MED ORDER — PROPOFOL 10 MG/ML IV EMUL
INTRAVENOUS | Status: DC | PRN
  Administered 2012-05-16: 150 mg via INTRAVENOUS

## 2012-05-16 MED ORDER — HYDRALAZINE HCL 20 MG/ML IJ SOLN
INTRAMUSCULAR | Status: DC | PRN
  Administered 2012-05-16 (×4): 4 mg via INTRAVENOUS

## 2012-05-16 MED ORDER — LACTATED RINGERS IV SOLN: INTRAVENOUS | Status: DC

## 2012-05-16 MED ORDER — ACETAMINOPHEN 10 MG/ML IV SOLN
INTRAVENOUS | Status: DC | PRN
  Administered 2012-05-16: 1000 mg via INTRAVENOUS

## 2012-05-16 MED ORDER — HYDROMORPHONE HCL PF 1 MG/ML IJ SOLN
INTRAMUSCULAR | Status: DC | PRN
  Administered 2012-05-16: 1 mg via INTRAVENOUS
  Administered 2012-05-16 (×2): 0.5 mg via INTRAVENOUS

## 2012-05-16 MED ORDER — KCL IN DEXTROSE-NACL 20-5-0.45 MEQ/L-%-% IV SOLN
INTRAVENOUS | Status: AC
  Filled 2012-05-16: qty 1000

## 2012-05-16 MED ORDER — FENTANYL CITRATE 0.05 MG/ML IJ SOLN
INTRAMUSCULAR | Status: DC | PRN
  Administered 2012-05-16 (×2): 50 ug via INTRAVENOUS
  Administered 2012-05-16: 100 ug via INTRAVENOUS
  Administered 2012-05-16 (×3): 50 ug via INTRAVENOUS

## 2012-05-16 MED ORDER — MORPHINE SULFATE 2 MG/ML IJ SOLN
2.0000 mg | INTRAMUSCULAR | Status: DC | PRN
  Administered 2012-05-16: 2 mg via INTRAVENOUS
  Filled 2012-05-16: qty 1

## 2012-05-16 MED ORDER — NEOSTIGMINE METHYLSULFATE 1 MG/ML IJ SOLN
INTRAMUSCULAR | Status: DC | PRN
  Administered 2012-05-16: 5 mg via INTRAVENOUS

## 2012-05-16 MED ORDER — SUCCINYLCHOLINE CHLORIDE 20 MG/ML IJ SOLN
INTRAMUSCULAR | Status: DC | PRN
  Administered 2012-05-16: 100 mg via INTRAVENOUS

## 2012-05-16 MED ORDER — ONDANSETRON HCL 4 MG PO TABS: 4.0000 mg | ORAL_TABLET | Freq: Four times a day (QID) | ORAL | Status: DC | PRN

## 2012-05-16 MED ORDER — HYDROMORPHONE HCL PF 1 MG/ML IJ SOLN: 0.2500 mg | INTRAMUSCULAR | Status: DC | PRN

## 2012-05-16 SURGICAL SUPPLY — 64 items
APL SKNCLS STERI-STRIP NONHPOA (GAUZE/BANDAGES/DRESSINGS) ×2
BAG URINE DRAINAGE (UROLOGICAL SUPPLIES) ×2 IMPLANT
BARRIER ADHS 3X4 INTERCEED (GAUZE/BANDAGES/DRESSINGS) ×2 IMPLANT
BENZOIN TINCTURE PRP APPL 2/3 (GAUZE/BANDAGES/DRESSINGS) ×3 IMPLANT
BLADELESS LONG 8MM (BLADE) ×3 IMPLANT
BRR ADH 4X3 ABS CNTRL BYND (GAUZE/BANDAGES/DRESSINGS)
CABLE HIGH FREQUENCY MONO STRZ (ELECTRODE) ×3 IMPLANT
CATH FOLEY 3WAY  5CC 16FR (CATHETERS)
CATH FOLEY 3WAY 5CC 16FR (CATHETERS) ×2 IMPLANT
CLOTH BEACON ORANGE TIMEOUT ST (SAFETY) ×3 IMPLANT
CORDS BIPOLAR (ELECTRODE) ×1 IMPLANT
COVER MAYO STAND STRL (DRAPES) ×2 IMPLANT
COVER TIP SHEARS 8 DVNC (MISCELLANEOUS) ×2 IMPLANT
COVER TIP SHEARS 8MM DA VINCI (MISCELLANEOUS) ×1
DECANTER SPIKE VIAL GLASS SM (MISCELLANEOUS) ×3 IMPLANT
DRAPE CAMERA CLOSED 9X96 (DRAPES) IMPLANT
DRAPE LG THREE QUARTER DISP (DRAPES) ×8 IMPLANT
DRAPE TABLE BACK 44X90 PK DISP (DRAPES) ×10 IMPLANT
DRAPE WARM FLUID 44X44 (DRAPE) ×3 IMPLANT
DRSG TEGADERM 6X8 (GAUZE/BANDAGES/DRESSINGS) ×1 IMPLANT
ELECT REM PT RETURN 9FT ADLT (ELECTROSURGICAL) ×3
ELECTRODE REM PT RTRN 9FT ADLT (ELECTROSURGICAL) ×2 IMPLANT
FILTER SMOKE EVAC (FILTER) ×3 IMPLANT
GAUZE VASELINE 3X9 (GAUZE/BANDAGES/DRESSINGS) IMPLANT
GLOVE BIO SURGEON STRL SZ 6.5 (GLOVE) ×9 IMPLANT
GLOVE BIO SURGEON STRL SZ8 (GLOVE) ×6 IMPLANT
GLOVE BIOGEL PI IND STRL 7.0 (GLOVE) ×8 IMPLANT
GLOVE BIOGEL PI INDICATOR 7.0 (GLOVE) ×5
GLOVE ECLIPSE 6.5 STRL STRAW (GLOVE) ×12 IMPLANT
GLOVE SURG SS PI 7.0 STRL IVOR (GLOVE) ×1 IMPLANT
GOWN PREVENTION PLUS LG XLONG (DISPOSABLE) ×6 IMPLANT
GOWN STRL NON-REIN LRG LVL3 (GOWN DISPOSABLE) ×1 IMPLANT
GOWN STRL REIN XL XLG (GOWN DISPOSABLE) ×9 IMPLANT
KIT ACCESSORY DA VINCI DISP (KITS) ×1
KIT ACCESSORY DVNC DISP (KITS) IMPLANT
MANIPULATOR UTERINE 4.5 ZUMI (MISCELLANEOUS) ×3 IMPLANT
NDL INSUFFLATION 14GA 120MM (NEEDLE) ×2 IMPLANT
NEEDLE INSUFFLATION 14GA 120MM (NEEDLE) IMPLANT
NS IRRIG 1000ML POUR BTL (IV SOLUTION) ×9 IMPLANT
OCCLUDER COLPOPNEUMO (BALLOONS) ×1 IMPLANT
PACK LAPAROSCOPY W LONG (CUSTOM PROCEDURE TRAY) ×3 IMPLANT
PACK ROBOTIC CUSTOM GYN (CUSTOM PROCEDURE TRAY) ×2 IMPLANT
PLUG CATH AND CAP STER (CATHETERS) ×2 IMPLANT
SET IRRIG TUBING LAPAROSCOPIC (IRRIGATION / IRRIGATOR) ×3 IMPLANT
SHEET LAVH (DRAPES) ×3 IMPLANT
SOLUTION ELECTROLUBE (MISCELLANEOUS) ×3 IMPLANT
SPONGE LAP 18X18 X RAY DECT (DISPOSABLE) ×1 IMPLANT
STRIP CLOSURE SKIN 1/4X4 (GAUZE/BANDAGES/DRESSINGS) ×3 IMPLANT
SYR 50ML LL SCALE MARK (SYRINGE) ×3 IMPLANT
SYSTEM CONVERTIBLE TROCAR (TROCAR) IMPLANT
TIP UTERINE 5.1X6CM LAV DISP (MISCELLANEOUS) IMPLANT
TIP UTERINE 6.7X10CM GRN DISP (MISCELLANEOUS) IMPLANT
TIP UTERINE 6.7X6CM WHT DISP (MISCELLANEOUS) IMPLANT
TIP UTERINE 6.7X8CM BLUE DISP (MISCELLANEOUS) IMPLANT
TOWEL OR 17X26 10 PK STRL BLUE (TOWEL DISPOSABLE) ×7 IMPLANT
TOWEL OR NON WOVEN STRL DISP B (DISPOSABLE) ×3 IMPLANT
TRAY FOLEY CATH 14FRSI W/METER (CATHETERS) ×1 IMPLANT
TROCAR 12M 150ML BLUNT (TROCAR) IMPLANT
TROCAR DISP BLADELESS 8 DVNC (TROCAR) ×2 IMPLANT
TROCAR DISP BLADELESS 8MM (TROCAR) ×1
TROCAR Z-THREAD 12X150 (TROCAR) ×3 IMPLANT
TROCAR Z-THREAD OPTICAL 5X100M (TROCAR) ×1 IMPLANT
TUBING FILTER THERMOFLATOR (ELECTROSURGICAL) ×2 IMPLANT
WARMER LAPAROSCOPE (MISCELLANEOUS) ×3 IMPLANT

## 2012-05-16 NOTE — Transfer of Care (Signed)
Immediate Anesthesia Transfer of Care Note  Patient: [name]  Procedure(s) Performed: Procedure(s) (LRB): ROBOTIC ASSISTED TOTAL HYSTERECTOMY (N/A) BILATERAL SALPINGECTOMY (Bilateral)  Patient Location: PACU  Anesthesia Type: General  Level of Consciousness: sedated, patient cooperative and responds to stimulaton  Airway & Oxygen Therapy: Patient Spontanous Breathing and Patient connected to face mask oxgen  Post-op Assessment: Report given to PACU RN and Post -op Vital signs reviewed and stable  Post vital signs: Reviewed and stable  Complications: No apparent anesthesia complications

## 2012-05-16 NOTE — Anesthesia Preprocedure Evaluation (Addendum)
Anesthesia Evaluation  Patient identified by MRN, date of birth, ID band Patient awake    Reviewed: Allergy & Precautions, H&P , NPO status , Patient's Chart, lab work & pertinent test results  Airway Mallampati: II TM Distance: >3 FB Neck ROM: full    Dental No notable dental hx. (+) Teeth Intact and Dental Advisory Given   Pulmonary neg pulmonary ROS,  breath sounds clear to auscultation  Pulmonary exam normal       Cardiovascular Exercise Tolerance: Good negative cardio ROS  Rhythm:regular Rate:Normal     Neuro/Psych negative neurological ROS  negative psych ROS   GI/Hepatic negative GI ROS, Neg liver ROS,   Endo/Other  negative endocrine ROS  Renal/GU negative Renal ROS  negative genitourinary   Musculoskeletal   Abdominal   Peds  Hematology negative hematology ROS (+) Blood dyscrasia, anemia , Hgb 10.5   Anesthesia Other Findings   Reproductive/Obstetrics negative OB ROS                          Anesthesia Physical Anesthesia Plan  ASA: II  Anesthesia Plan: General   Post-op Pain Management:    Induction: Intravenous  Airway Management Planned: Oral ETT  Additional Equipment:   Intra-op Plan:   Post-operative Plan: Extubation in OR  Informed Consent: I have reviewed the patients History and Physical, chart, labs and discussed the procedure including the risks, benefits and alternatives for the proposed anesthesia with the patient or authorized representative who has indicated his/her understanding and acceptance.   Dental Advisory Given  Plan Discussed with: CRNA and Surgeon  Anesthesia Plan Comments:        Anesthesia Quick Evaluation

## 2012-05-16 NOTE — Preoperative (Signed)
Beta Blockers   Reason not to administer Beta Blockers:Not Applicable 

## 2012-05-16 NOTE — OR Nursing (Signed)
Surgery delayed due to surgeon being involved in emergency surgery elsewhere. Pt. notified, transferred to Short Stay to await new surgery time.

## 2012-05-16 NOTE — Op Note (Signed)
Pre-operative Diagnosis: abnormal uterine bleeding and fibroids  Post-operative Diagnosis: same  Operation: Robotic-assisted hysterectomy with bilateral salpingectomy  Surgeon: Roseanna Rainbow  Assistant: Coral Ceo, MD  Anesthesia: GET  Urine Output:  Per Anesthesiology  Findings: Enlarged, globular uterus--approximately 12 weeks aggregate size  Estimated Blood Loss:  250 ml                 Total IV Fluids: per Anesthesiology          Specimens: PATHOLOGY               Complications:  None; patient tolerated the procedure well.         Disposition: PACU - hemodynamically stable.         Condition: Stable    Procedure Details  The patient was seen in the Holding Room. The risks, benefits, complications, treatment options, and expected outcomes were discussed with the patient.  The patient concurred with the proposed plan, giving informed consent.  The site of surgery properly noted/marked. The patient was identified as [name] and the procedure verified as a Robotic-assisted hysterectomy with bilateral salpingectomy. A Time Out was held and the above information confirmed.  After induction of anesthesia, the patient was draped and prepped in the usual sterile manner.  The patient was placed in supine position after anesthesia and draped and prepped in the usual sterile manner. The abdominal drape was placed after the CholoraPrep had been allowed to dry for 3 minutes.  Her arms were tucked to her side with all appropriate precautions.  The shoulder blocks were placed in the usual fashion.  The patient was placed in the semi-lithotomy position in Dime Box stirrups.  The perineum was prepped with Betadine.  Foley catheter was placed.  A sterile speculum was placed in the vagina.  The cervix was grasped with a single-tooth tenaculum and dilated with Shawnie Pons dilators.  The ZUMI uterine manipulator with a medium colpotomizer ring was placed without difficulty.  A pneum  occluder balloon was placed over the manipulator.  A second time-out was performed.  OG tube placement was confirmed and to suction.  Approximately 2 cm below the costal margin, in the midclavicular line the skin was anesthestized with 0.25% Marcaine.  A 5 mm incision was made and using a 5 mm Optiview, a 5 mm trocar was placed under direct vision.  The patient's abdomen was insufflated with CO2 gas.  At this point and all points during the procedure, the patient's intra-abdominal pressure did not exceed 15 mmHg.  A 10-12 camera port was place 24 cm above the pubic symphysis.  Bilateral 8 mm ports were place 10 cm and 15 degrees inferior.  All ports were placed under direct visualization.  Filmy omental adhesions involving the omentum to anterior abdominal wall were divided with the endoshears. The 5 mm port was removed.  The incision was extended to accommodate a 10 mm trocar.  A 10 mm trocar and sleeve were advanced under direct visualization.   The robot was docked in the usual fashion.  The round ligament on the right was transected with monopolar cautery.  The anterior and posterior leaves of the broad ligament were opened.  The ureter was identified.  A window was made between the infundibulopelvic ligament and the ureter.  The utero ovarian ligament and fallopian tubes were coagulated with bipolar cautery and transected.  The uterine vessels were skeletonized to the level of the Koh ring.  A C-loop was created in the usual fashion.  A similar procedure was performed on the patient's left side. The round ligament on the left was transected with monopolar cautery.  The anterior and posterior leaves of the broad ligament were opened.  The ureter was identified.  A window was made between the infundibulopelvic ligament and the ureter.  The utero ovarian ligament and fallopian tubes were coagulated with bipolar cautery and transected.  The uterine vessels were skeletonized to the level of the Koh ring.  A C-loop  was created in the usual fashion.   The bladder flap was completed.  At this time, the pneum occluder balloon was insufflated and a posterior colpotomy was performed.  The uterus was then bivalved to the level of the internal os.  The uterus and cervix were delivered through the vagina.  The right fallopian tube was then grasped.  The mesosalpinx was transected.  The fallopian tube was removed from the abdomen.  The left fallopian tube was manipulated in a similar fashion.  All pedicles were inspected and noted to be hemostatic.  The vagina cuff was closed using 0-Vicryl on a CT-1 needle in a running fashion.  The abdomen and pelvis were copiously irrigated.  The needle was removed without difficulty.  The instruments were then removed under direct visualization and the robotic ports removed.  The robot was undocked.  Deep, subcutaneous, figure-of-eight 0-Vicryl sutures on a UR-6 needle were placed in the 10-12 mm supraumbilical and infracostal incisions.  All skin incisions were closed in a subcuticular fashion using 3-0 Monocryl.  Steri-strips and Benzoin were applied.  The vagina was swabbed with minimal bleeding noted.   All instrument and needle counts were correct x  2.

## 2012-05-16 NOTE — Anesthesia Postprocedure Evaluation (Signed)
  Anesthesia Post-op Note  Patient: [name]  Procedure(s) Performed: Procedure(s) (LRB): ROBOTIC ASSISTED TOTAL HYSTERECTOMY (N/A) BILATERAL SALPINGECTOMY (Bilateral)  Patient Location: PACU  Anesthesia Type: General  Level of Consciousness: awake and alert   Airway and Oxygen Therapy: Patient Spontanous Breathing  Post-op Pain: mild  Post-op Assessment: Post-op Vital signs reviewed, Patient's Cardiovascular Status Stable, Respiratory Function Stable, Patent Airway and No signs of Nausea or vomiting  Post-op Vital Signs: stable  Complications: No apparent anesthesia complications

## 2012-05-16 NOTE — Interval H&P Note (Signed)
History and Physical Interval Note:  05/16/2012 6:36 AM  Daniel Nones Braud  has presented today for surgery, with the diagnosis of Symptomatic Uterine fibroids  The various methods of treatment have been discussed with the patient and family. After consideration of risks, benefits and other options for treatment, the patient has consented to  Procedure(s) (LRB): ROBOTIC ASSISTED TOTAL HYSTERECTOMY (N/A) BILATERAL SALPINGECTOMY (Bilateral) as a surgical intervention .  The patient's history has been reviewed, patient examined, no change in status, stable for surgery.  I have reviewed the patient's chart and labs.  Questions were answered to the patient's satisfaction.     JACKSON-MOORE,Estephania Licciardi A

## 2012-05-17 LAB — BASIC METABOLIC PANEL
Chloride: 107 mEq/L (ref 96–112)
GFR calc Af Amer: >90 mL/min (ref >90)
Potassium: 4.1 mEq/L (ref 3.5–5.1)

## 2012-05-17 LAB — CBC
Platelets: 311 10*3/uL (ref 150–400)
RDW: 19 % — ABNORMAL HIGH (ref 11.5–15.5)
WBC: 12.5 10*3/uL — ABNORMAL HIGH (ref 4.0–10.5)

## 2012-05-17 MED ORDER — PNEUMOCOCCAL VAC POLYVALENT 25 MCG/0.5ML IJ INJ: 0.5000 mL | INJECTION | INTRAMUSCULAR | Status: DC

## 2012-05-17 MED ORDER — OXYCODONE-ACETAMINOPHEN 5-325 MG PO TABS: 1.0000 | ORAL_TABLET | Freq: Four times a day (QID) | ORAL | Status: AC | PRN

## 2012-05-17 MED ORDER — PNEUMOCOCCAL VAC POLYVALENT 25 MCG/0.5ML IJ INJ
0.5000 mL | INJECTION | Freq: Once | INTRAMUSCULAR | Status: DC
  Filled 2012-05-17: qty 0.5

## 2012-05-17 NOTE — Progress Notes (Signed)
Pt given discharge instructions and script. Voices understanding of instructions.

## 2012-05-17 NOTE — Discharge Summary (Signed)
  Physician Discharge Summary  Patient ID: [name] MRN: [id number] DOB/AGE: Dec 21, 1960 51 y.o.  Admit date: 05/16/2012 Discharge date: 05/17/2012  Admission Diagnoses:  Active Problems:  Leiomyoma of uterus, unspecified   Discharge Diagnoses:  Active Problems:  Leiomyoma of uterus, unspecified   Discharged Condition: good  Hospital Course: On 05/16/2012, the patient underwent the following: Procedure(s): ROBOTIC ASSISTED TOTAL HYSTERECTOMY BILATERAL SALPINGECTOMY.   The postoperative course was uneventful.  She was discharged to home on postoperative day 1 tolerating a regular diet.  Consults: None  Significant Diagnostic Studies: none  Treatments: surgery: see above  Discharge Exam: Blood pressure 126/69, pulse 78, temperature 97.9 F (36.6 C), temperature source Oral, resp. rate 18, height 5\' 4"  (1.626 m), weight 102.967 kg (227 lb), last menstrual period 03/26/2012, SpO2 98.00%. General appearance: alert GI: soft, non-tender; bowel sounds normal; no masses,  no organomegaly Extremities: extremities normal, atraumatic, no cyanosis or edema Incision/Wound:  C/D/I x 4  Disposition: 01-Home or Self Care  Discharge Orders    Future Orders Please Complete By Expires   Diet - low sodium heart healthy      Increase activity slowly      May walk up steps      May shower / Bathe      Comments:   No tub baths for 6 weeks   Driving Restrictions      Comments:   No driving for 1 week.   Lifting restrictions      Comments:   No lifting > 5 lbs for 6 weeks   Sexual Activity Restrictions      Comments:   No intercourse for  8 weeks.   Discharge wound care:      Comments:   Keep clean and dry   Call MD for:  temperature >100.4      Call MD for:  persistant nausea and vomiting      Call MD for:  severe uncontrolled pain      Call MD for:  redness, tenderness, or signs of infection (pain, swelling, redness, odor or green/yellow discharge around incision site)      Call MD for:  persistant dizziness or light-headedness      Call MD for:  extreme fatigue        Medication List  As of 05/17/2012 12:16 PM   TAKE these medications         fish oil-omega-3 fatty acids 1000 MG capsule   Take 1 g by mouth every other day.      ibuprofen 200 MG tablet   Commonly known as: ADVIL,MOTRIN   Take 200 mg by mouth every 6 (six) hours as needed. Pain      IRON PO   Take 1 tablet by mouth daily.      oxyCODONE-acetaminophen 5-325 MG per tablet   Commonly known as: PERCOCET/ROXICET   Take 1-2 tablets by mouth every 6 (six) hours as needed (moderate to severe pain (when tolerating fluids)).      vitamin E 400 UNIT capsule   Take 400 Units by mouth daily.           Follow-up Information    Follow up with Antionette Char A, MD. Schedule an appointment as soon as possible for a visit in 2 weeks.   Contact information:   [address] [place] [address] [phone]          Signed: Roseanna Rainbow 05/17/2012, 12:16 PM

## 2013-03-06 ENCOUNTER — Other Ambulatory Visit: Payer: Self-pay

## 2014-01-22 ENCOUNTER — Other Ambulatory Visit: Payer: Self-pay | Admitting: Gastroenterology

## 2014-01-22 DIAGNOSIS — Z1231 Encounter for screening mammogram for malignant neoplasm of breast: Secondary | ICD-10-CM

## 2014-01-29 ENCOUNTER — Encounter (INDEPENDENT_AMBULATORY_CARE_PROVIDER_SITE_OTHER): Payer: Self-pay

## 2014-01-29 ENCOUNTER — Ambulatory Visit: Payer: BC Managed Care – PPO

## 2014-01-29 ENCOUNTER — Ambulatory Visit
Admission: RE | Admit: 2014-01-29 | Discharge: 2014-01-29 | Disposition: A | Discharge status: Home / Self Care | Payer: BC Managed Care – PPO | Source: Ambulatory Visit | Attending: Gastroenterology | Admitting: Gastroenterology

## 2014-01-29 DIAGNOSIS — Z1231 Encounter for screening mammogram for malignant neoplasm of breast: Secondary | ICD-10-CM

## 2014-06-03 ENCOUNTER — Encounter (HOSPITAL_COMMUNITY): Payer: Self-pay | Admitting: Emergency Medicine

## 2014-06-03 ENCOUNTER — Emergency Department (HOSPITAL_COMMUNITY)
Admission: EM | Admit: 2014-06-03 | Discharge: 2014-06-03 | Discharge status: Against Medical Advice | Payer: BC Managed Care – PPO | Attending: Emergency Medicine | Admitting: Emergency Medicine

## 2014-06-03 DIAGNOSIS — R040 Epistaxis: Secondary | ICD-10-CM | POA: Diagnosis present

## 2014-06-03 DIAGNOSIS — R011 Cardiac murmur, unspecified: Secondary | ICD-10-CM | POA: Insufficient documentation

## 2014-06-03 DIAGNOSIS — J45909 Unspecified asthma, uncomplicated: Secondary | ICD-10-CM | POA: Diagnosis not present

## 2014-06-03 HISTORY — DX: Plantar fascial fibromatosis: M72.2

## 2014-06-03 NOTE — ED Notes (Signed)
Pt states that she has had a nosebleed off and this evening; bleeding currently under control at present; pt states that it began around 3am yesterday am; pt states that she was upstairs with her father and it began again.

## 2014-06-03 NOTE — ED Notes (Signed)
Pt requests to leave after triage; pt states that her nose has stopped bleeding and her VS were good and she feels like she will be fine til she can see her PCP; RN offered further evaluation by MD and by declined and signed out AMA

## 2015-03-31 ENCOUNTER — Encounter (HOSPITAL_COMMUNITY): Payer: Self-pay | Admitting: Emergency Medicine

## 2015-03-31 ENCOUNTER — Emergency Department (INDEPENDENT_AMBULATORY_CARE_PROVIDER_SITE_OTHER)
Admission: EM | Admit: 2015-03-31 | Discharge: 2015-03-31 | Disposition: A | Discharge status: Home / Self Care | Payer: Self-pay | Source: Home / Self Care | Attending: Family Medicine | Admitting: Family Medicine

## 2015-03-31 DIAGNOSIS — S39012A Strain of muscle, fascia and tendon of lower back, initial encounter: Secondary | ICD-10-CM

## 2015-03-31 MED ORDER — METHOCARBAMOL 500 MG PO TABS: 500.0000 mg | ORAL_TABLET | Freq: Four times a day (QID) | ORAL | Status: DC | PRN

## 2015-03-31 MED ORDER — DICLOFENAC SODIUM 75 MG PO TBEC: 75.0000 mg | DELAYED_RELEASE_TABLET | Freq: Two times a day (BID) | ORAL | Status: DC
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
## 2016-12-05 DIAGNOSIS — Z111 Encounter for screening for respiratory tuberculosis: Secondary | ICD-10-CM | POA: Diagnosis not present

## 2017-01-03 DIAGNOSIS — R9431 Abnormal electrocardiogram [ECG] [EKG]: Secondary | ICD-10-CM | POA: Diagnosis not present

## 2017-01-03 DIAGNOSIS — Z01411 Encounter for gynecological examination (general) (routine) with abnormal findings: Secondary | ICD-10-CM | POA: Diagnosis not present

## 2017-01-03 DIAGNOSIS — Z0001 Encounter for general adult medical examination with abnormal findings: Secondary | ICD-10-CM | POA: Diagnosis not present

## 2017-01-03 DIAGNOSIS — I10 Essential (primary) hypertension: Secondary | ICD-10-CM | POA: Diagnosis not present

## 2017-01-03 DIAGNOSIS — N760 Acute vaginitis: Secondary | ICD-10-CM | POA: Diagnosis not present

## 2017-01-03 DIAGNOSIS — Z0000 Encounter for general adult medical examination without abnormal findings: Secondary | ICD-10-CM | POA: Diagnosis not present

## 2017-01-03 DIAGNOSIS — Z1212 Encounter for screening for malignant neoplasm of rectum: Secondary | ICD-10-CM | POA: Diagnosis not present

## 2017-01-03 DIAGNOSIS — Z01419 Encounter for gynecological examination (general) (routine) without abnormal findings: Secondary | ICD-10-CM | POA: Diagnosis not present

## 2017-01-24 ENCOUNTER — Ambulatory Visit (INDEPENDENT_AMBULATORY_CARE_PROVIDER_SITE_OTHER): Payer: Self-pay | Admitting: Cardiology

## 2017-01-24 ENCOUNTER — Encounter: Payer: Self-pay | Admitting: Cardiology

## 2017-01-24 VITALS — BP 125/80 | HR 69 | Ht 64.0 in | Wt 234.8 lb

## 2017-01-24 DIAGNOSIS — Z720 Tobacco use: Secondary | ICD-10-CM

## 2017-01-24 DIAGNOSIS — R9431 Abnormal electrocardiogram [ECG] [EKG]: Secondary | ICD-10-CM

## 2017-01-24 DIAGNOSIS — I10 Essential (primary) hypertension: Secondary | ICD-10-CM

## 2017-01-24 DIAGNOSIS — R002 Palpitations: Secondary | ICD-10-CM

## 2017-01-24 NOTE — Progress Notes (Signed)
    Referring-Montgomery, Robin MD Reason for referral-Abnormal ECG  HPI: 56 year old female I am asked to evaluate by Dr. Baird Cancer for abnormal electrocardiogram. Patient was recently noted to have possible left atrial enlargement on her electrocardiogram and cardiology asked to evaluate. She has dyspnea with more extreme activities but not routine activities. Occasional brief palpitations with stress described as a skip. No chest pain or syncope.   Current Outpatient Prescriptions  Medication Sig Dispense Refill  . fish oil-omega-3 fatty acids 1000 MG capsule Take 1 g by mouth every other day.    . ibuprofen (ADVIL,MOTRIN) 200 MG tablet Take 200 mg by mouth every 6 (six) hours as needed. Pain    . IRON PO Take 1 tablet by mouth daily.     No current facility-administered medications for this visit.     No Known Allergies   Past Medical History:  Diagnosis Date  . Anemia   . Asthma    no problems in years   . Heart murmur    as a child   . Hypertension   . Plantar fasciitis     Past Surgical History:  Procedure Laterality Date  . ABDOMINAL HYSTERECTOMY    . CESAREAN SECTION      Social History   Social History  . Marital status: Married    Spouse name: N/A  . Number of children: 4  . Years of education: N/A   Occupational History  . Not on file.   Social History Main Topics  . Smoking status: Current Every Day Smoker    Types: Cigarettes  . Smokeless tobacco: Not on file  . Alcohol use Yes     Comment: socially  . Drug use: No     Comment: hx of marijuana use 4 years ago   . Sexual activity: Yes    Birth control/ protection: Condom   Other Topics Concern  . Not on file   Social History Narrative  . No narrative on file    Family History  Problem Relation Age of Onset  . Hypertension Mother   . Heart failure Mother   . Diabetes Father   . Hypertension Father   . Heart failure Father   . Stroke Other     ROS: no fevers or chills, productive cough,  hemoptysis, dysphasia, odynophagia, melena, hematochezia, dysuria, hematuria, rash, seizure activity, orthopnea, PND, pedal edema, claudication. Remaining systems are negative.  Physical Exam:   Blood pressure 125/80, pulse 69, height 5\' 4"  (1.626 m), weight 234 lb 12.8 oz (106.5 kg), last menstrual period 04/19/2012.  General:  Well developed/well nourished in NAD Skin warm/dry Patient not depressed No peripheral clubbing Back-normal HEENT-normal/normal eyelids Neck supple/normal carotid upstroke bilaterally; no bruits; no JVD; no thyromegaly chest - CTA/ normal expansion CV - RRR/normal S1 and S2; no rubs or gallops;  PMI nondisplaced; 1/6 SEM Abdomen -NT/ND, no HSM, no mass, + bowel sounds, no bruit 2+ femoral pulses, no bruits Ext-no edema, chords, 2+ DP Neuro-grossly nonfocal  ECG - 01/03/2017-sinus rhythm and possible left atrial enlargement. personally reviewed  A/P  1 abnormal electrocardiogram-possible left atrial enlargement. This may be secondary to history of hypertension. I will arrange an echocardiogram to assess LV function and chamber size.  2 hypertension-blood pressure controlled. Continue present medications.  3 tobacco abuse-patient counseled on discontinuing.  4 palpitations-occasional brief skip with stress. Not sustained and no syncope. We will arrange an echocardiogram to assess LV function. Symptoms not limiting at this point.  Kirk Ruths, MD

## 2017-01-24 NOTE — Patient Instructions (Signed)
Medication Instructions:   NO CHANGE  Testing/Procedures:  Your physician has requested that you have an echocardiogram. Echocardiography is a painless test that uses sound waves to create images of your heart. It provides your doctor with information about the size and shape of your heart and how well your heart's chambers and valves are working. This procedure takes approximately one hour. There are no restrictions for this procedure.    Follow-Up:  Your physician recommends that you schedule a follow-up appointment in: AS NEEDED PENDING TEST RESULTS      

## 2017-02-11 ENCOUNTER — Other Ambulatory Visit: Payer: Self-pay

## 2017-02-11 ENCOUNTER — Ambulatory Visit (HOSPITAL_COMMUNITY): Payer: BLUE CROSS/BLUE SHIELD | Attending: Cardiology

## 2017-02-11 DIAGNOSIS — R9431 Abnormal electrocardiogram [ECG] [EKG]: Secondary | ICD-10-CM | POA: Diagnosis not present

## 2017-02-14 DIAGNOSIS — J45901 Unspecified asthma with (acute) exacerbation: Secondary | ICD-10-CM | POA: Diagnosis not present

## 2017-02-14 DIAGNOSIS — J00 Acute nasopharyngitis [common cold]: Secondary | ICD-10-CM | POA: Diagnosis not present

## 2017-02-14 DIAGNOSIS — I10 Essential (primary) hypertension: Secondary | ICD-10-CM | POA: Diagnosis not present

## 2017-02-26 DIAGNOSIS — H2513 Age-related nuclear cataract, bilateral: Secondary | ICD-10-CM | POA: Diagnosis not present

## 2017-02-26 DIAGNOSIS — H04123 Dry eye syndrome of bilateral lacrimal glands: Secondary | ICD-10-CM | POA: Diagnosis not present

## 2017-06-18 DIAGNOSIS — R0982 Postnasal drip: Secondary | ICD-10-CM | POA: Diagnosis not present

## 2017-06-18 DIAGNOSIS — I10 Essential (primary) hypertension: Secondary | ICD-10-CM | POA: Diagnosis not present

## 2017-08-07 DIAGNOSIS — Z6841 Body Mass Index (BMI) 40.0 and over, adult: Secondary | ICD-10-CM | POA: Diagnosis not present

## 2017-08-07 DIAGNOSIS — I10 Essential (primary) hypertension: Secondary | ICD-10-CM | POA: Diagnosis not present

## 2017-08-07 DIAGNOSIS — R7309 Other abnormal glucose: Secondary | ICD-10-CM | POA: Diagnosis not present

## 2017-08-07 DIAGNOSIS — J45909 Unspecified asthma, uncomplicated: Secondary | ICD-10-CM | POA: Diagnosis not present

## 2018-03-12 DIAGNOSIS — Z720 Tobacco use: Secondary | ICD-10-CM | POA: Diagnosis not present

## 2018-03-12 DIAGNOSIS — F4320 Adjustment disorder, unspecified: Secondary | ICD-10-CM | POA: Diagnosis not present

## 2018-03-12 DIAGNOSIS — Z0000 Encounter for general adult medical examination without abnormal findings: Secondary | ICD-10-CM | POA: Diagnosis not present

## 2018-03-12 DIAGNOSIS — I10 Essential (primary) hypertension: Secondary | ICD-10-CM | POA: Diagnosis not present

## 2018-03-20 ENCOUNTER — Other Ambulatory Visit: Payer: Self-pay | Admitting: Internal Medicine

## 2018-03-20 DIAGNOSIS — Z1231 Encounter for screening mammogram for malignant neoplasm of breast: Secondary | ICD-10-CM

## 2018-05-14 ENCOUNTER — Ambulatory Visit: Payer: BLUE CROSS/BLUE SHIELD

## 2018-06-12 ENCOUNTER — Ambulatory Visit: Payer: BLUE CROSS/BLUE SHIELD

## 2018-06-13 ENCOUNTER — Ambulatory Visit
Admission: RE | Admit: 2018-06-13 | Discharge: 2018-06-13 | Disposition: A | Discharge status: Home / Self Care | Payer: BLUE CROSS/BLUE SHIELD | Source: Ambulatory Visit | Attending: Internal Medicine | Admitting: Internal Medicine

## 2018-06-13 DIAGNOSIS — Z1231 Encounter for screening mammogram for malignant neoplasm of breast: Secondary | ICD-10-CM

## 2019-03-18 ENCOUNTER — Encounter: Payer: Self-pay | Admitting: Internal Medicine

## 2019-04-29 ENCOUNTER — Encounter: Payer: Self-pay | Admitting: Internal Medicine

## 2019-04-29 ENCOUNTER — Ambulatory Visit (INDEPENDENT_AMBULATORY_CARE_PROVIDER_SITE_OTHER): Payer: Self-pay | Admitting: Internal Medicine

## 2019-04-29 ENCOUNTER — Other Ambulatory Visit: Payer: Self-pay

## 2019-04-29 VITALS — BP 136/88 | HR 88 | Temp 97.9°F | Ht 63.4 in | Wt 243.0 lb

## 2019-04-29 DIAGNOSIS — Z0000 Encounter for general adult medical examination without abnormal findings: Secondary | ICD-10-CM

## 2019-04-29 DIAGNOSIS — R635 Abnormal weight gain: Secondary | ICD-10-CM

## 2019-04-29 DIAGNOSIS — I10 Essential (primary) hypertension: Secondary | ICD-10-CM

## 2019-04-29 DIAGNOSIS — Z6841 Body Mass Index (BMI) 40.0 and over, adult: Secondary | ICD-10-CM

## 2019-04-29 LAB — POCT URINALYSIS DIPSTICK
Bilirubin, UA: NEGATIVE
Blood, UA: NEGATIVE
Glucose, UA: NEGATIVE
Ketones, UA: NEGATIVE
Leukocytes, UA: NEGATIVE
Nitrite, UA: NEGATIVE
Protein, UA: NEGATIVE
Spec Grav, UA: >=1.03 — AB (ref 1.010–1.025)
Urobilinogen, UA: 0.2 E.U./dL
pH, UA: 5.5 (ref 5.0–8.0)

## 2019-04-29 LAB — POCT UA - MICROALBUMIN
Albumin/Creatinine Ratio, Urine, POC: <30
Creatinine, POC: 300 mg/dL
Microalbumin Ur, POC: 30 mg/L

## 2019-04-29 NOTE — Patient Instructions (Signed)
Health Maintenance, Female Adopting a healthy lifestyle and getting preventive care are important in promoting health and wellness. Ask your health care provider about:  The right schedule for you to have regular tests and exams.  Things you can do on your own to prevent diseases and keep yourself healthy. What should I know about diet, weight, and exercise? Eat a healthy diet   Eat a diet that includes plenty of vegetables, fruits, low-fat dairy products, and lean protein.  Do not eat a lot of foods that are high in solid fats, added sugars, or sodium. Maintain a healthy weight Body mass index (BMI) is used to identify weight problems. It estimates body fat based on height and weight. Your health care provider can help determine your BMI and help you achieve or maintain a healthy weight. Get regular exercise Get regular exercise. This is one of the most important things you can do for your health. Most adults should:  Exercise for at least 150 minutes each week. The exercise should increase your heart rate and make you sweat (moderate-intensity exercise).  Do strengthening exercises at least twice a week. This is in addition to the moderate-intensity exercise.  Spend less time sitting. Even light physical activity can be beneficial. Watch cholesterol and blood lipids Have your blood tested for lipids and cholesterol at 59 years of age, then have this test every 5 years. Have your cholesterol levels checked more often if:  Your lipid or cholesterol levels are high.  You are older than 59 years of age.  You are at high risk for heart disease. What should I know about cancer screening? Depending on your health history and family history, you may need to have cancer screening at various ages. This may include screening for:  Breast cancer.  Cervical cancer.  Colorectal cancer.  Skin cancer.  Lung cancer. What should I know about heart disease, diabetes, and high blood  pressure? Blood pressure and heart disease  High blood pressure causes heart disease and increases the risk of stroke. This is more likely to develop in people who have high blood pressure readings, are of African descent, or are overweight.  Have your blood pressure checked: ? Every 3-5 years if you are 18-39 years of age. ? Every year if you are 40 years old or older. Diabetes Have regular diabetes screenings. This checks your fasting blood sugar level. Have the screening done:  Once every three years after age 40 if you are at a normal weight and have a low risk for diabetes.  More often and at a younger age if you are overweight or have a high risk for diabetes. What should I know about preventing infection? Hepatitis B If you have a higher risk for hepatitis B, you should be screened for this virus. Talk with your health care provider to find out if you are at risk for hepatitis B infection. Hepatitis C Testing is recommended for:  Everyone born from 1945 through 1965.  Anyone with known risk factors for hepatitis C. Sexually transmitted infections (STIs)  Get screened for STIs, including gonorrhea and chlamydia, if: ? You are sexually active and are younger than 59 years of age. ? You are older than 59 years of age and your health care provider tells you that you are at risk for this type of infection. ? Your sexual activity has changed since you were last screened, and you are at increased risk for chlamydia or gonorrhea. Ask your health care provider if   you are at risk.  Ask your health care provider about whether you are at high risk for HIV. Your health care provider may recommend a prescription medicine to help prevent HIV infection. If you choose to take medicine to prevent HIV, you should first get tested for HIV. You should then be tested every 3 months for as long as you are taking the medicine. Pregnancy  If you are about to stop having your period (premenopausal) and  you may become pregnant, seek counseling before you get pregnant.  Take 400 to 800 micrograms (mcg) of folic acid every day if you become pregnant.  Ask for birth control (contraception) if you want to prevent pregnancy. Osteoporosis and menopause Osteoporosis is a disease in which the bones lose minerals and strength with aging. This can result in bone fractures. If you are 65 years old or older, or if you are at risk for osteoporosis and fractures, ask your health care provider if you should:  Be screened for bone loss.  Take a calcium or vitamin D supplement to lower your risk of fractures.  Be given hormone replacement therapy (HRT) to treat symptoms of menopause. Follow these instructions at home: Lifestyle  Do not use any products that contain nicotine or tobacco, such as cigarettes, e-cigarettes, and chewing tobacco. If you need help quitting, ask your health care provider.  Do not use street drugs.  Do not share needles.  Ask your health care provider for help if you need support or information about quitting drugs. Alcohol use  Do not drink alcohol if: ? Your health care provider tells you not to drink. ? You are pregnant, may be pregnant, or are planning to become pregnant.  If you drink alcohol: ? Limit how much you use to 0-1 drink a day. ? Limit intake if you are breastfeeding.  Be aware of how much alcohol is in your drink. In the [place] one drink equals one 12 oz bottle of beer (355 mL), one 5 oz glass of wine (148 mL), or one 1 oz glass of hard liquor (44 mL). General instructions  Schedule regular health, dental, and eye exams.  Stay current with your vaccines.  Tell your health care provider if: ? You often feel depressed. ? You have ever been abused or do not feel safe at home. Summary  Adopting a healthy lifestyle and getting preventive care are important in promoting health and wellness.  Follow your health care provider's instructions about healthy  diet, exercising, and getting tested or screened for diseases.  Follow your health care provider's instructions on monitoring your cholesterol and blood pressure. This information is not intended to replace advice given to you by your health care provider. Make sure you discuss any questions you have with your health care provider. Document Released: 04/02/2011 Document Revised: 09/10/2018 Document Reviewed: 09/10/2018 Elsevier Patient Education  2020 Elsevier Inc.  

## 2019-04-29 NOTE — Progress Notes (Addendum)
Subjective:     Patient ID: [name] , female    DOB: 06/16/1961 , 58 y.o.   MRN: [id number]   Chief Complaint  Patient presents with  . Annual Exam    HPI  She is here today for a full physical exam. She is no longer followed by GYN. She has no specific concerns at this time.     Past Medical History:  Diagnosis Date  . Anemia   . Asthma    no problems in years   . Heart murmur    as a child   . Hypertension   . Plantar fasciitis      Family History  Problem Relation Age of Onset  . Hypertension Mother   . Thyroid disease Mother   . Gout Mother   . Diabetes Father   . Hypertension Father   . Heart failure Father   . Stroke Other      Current Outpatient Medications:  .  ibuprofen (ADVIL) 200 MG tablet, Take 200 mg by mouth every 6 (six) hours as needed., Disp: , Rfl:  .  Multiple Vitamins-Minerals (ONE-A-DAY WOMENS 50 PLUS PO), Take by mouth., Disp: , Rfl:  .  NON FORMULARY, cbd oil, Disp: , Rfl:    No Known Allergies    The patient states she uses nothing for birth control. Last LMP was Patient's last menstrual period was 04/19/2012..  Negative for: breast discharge, breast lump(s), breast pain and breast self exam. Associated symptoms include abnormal vaginal bleeding. Pertinent negatives include abnormal bleeding (hematology), anxiety, decreased libido, depression, difficulty falling sleep, dyspareunia, history of infertility, nocturia, sexual dysfunction, sleep disturbances, urinary incontinence, urinary urgency, vaginal discharge and vaginal itching. Diet regular.The patient states her exercise level is  intermittent.  . The patient's tobacco use is:  Social History   Tobacco Use  Smoking Status Current Every Day Smoker  . Packs/day: 0.25  . Years: 40.00  . Pack years: 10.00  . Types: Cigarettes  Smokeless Tobacco Never Used  . She has been exposed to passive smoke. The patient's alcohol use is:  Social History   Substance and Sexual Activity   Alcohol Use Yes   Comment: socially    Review of Systems  Constitutional: Negative.   HENT: Negative.   Eyes: Negative.   Respiratory: Negative.   Cardiovascular: Negative.   Endocrine: Negative.   Genitourinary: Negative.   Musculoskeletal: Negative.   Skin: Negative.   Allergic/Immunologic: Negative.   Neurological: Negative.   Hematological: Negative.   Psychiatric/Behavioral: Negative.      Today's Vitals   04/29/19 1442  BP: 136/88  Pulse: 88  Temp: 97.9 F (36.6 C)  TempSrc: Oral  Weight: 243 lb (110.2 kg)  Height: 5' 3.4" (1.61 m)   Body mass index is 42.5 kg/m.   Objective:  Physical Exam Vitals signs and nursing note reviewed.  Constitutional:      Appearance: Normal appearance.  HENT:     Head: Normocephalic and atraumatic.     Right Ear: Tympanic membrane, ear canal and external ear normal.     Left Ear: Tympanic membrane, ear canal and external ear normal.     Nose: Nose normal.     Mouth/Throat:     Mouth: Mucous membranes are moist.     Pharynx: Oropharynx is clear.  Eyes:     Extraocular Movements: Extraocular movements intact.     Conjunctiva/sclera: Conjunctivae normal.     Pupils: Pupils are equal, round, and reactive to light.  Neck:  Musculoskeletal: Normal range of motion and neck supple.  Cardiovascular:     Rate and Rhythm: Normal rate and regular rhythm.     Pulses: Normal pulses.     Heart sounds: Normal heart sounds.  Pulmonary:     Effort: Pulmonary effort is normal.     Breath sounds: Normal breath sounds.  Chest:     Breasts: Tanner Score is 5.        Right: Normal.        Left: Normal.     Comments: B/l supernumerary nipples Abdominal:     General: Abdomen is protuberant. Bowel sounds are normal.     Palpations: Abdomen is soft.     Comments: Soft, obese, difficult to assess organomegaly.   Genitourinary:    Comments: deferred Musculoskeletal: Normal range of motion.  Skin:    General: Skin is warm and dry.   Neurological:     General: No focal deficit present.     Mental Status: She is alert and oriented to person, place, and time.  Psychiatric:        Mood and Affect: Mood normal.        Behavior: Behavior normal.         Assessment And Plan:     1. Routine general medical examination at health care facility  A full exam was performed. Importance of monthly self breast exams was discussed with the patient. PATIENT HAS BEEN ADVISED TO GET 30-45 MINUTES REGULAR EXERCISE NO LESS THAN FOUR TO FIVE DAYS PER WEEK - BOTH WEIGHTBEARING EXERCISES AND AEROBIC ARE RECOMMENDED.  SHE IS ADVISED TO FOLLOW A HEALTHY DIET WITH AT LEAST SIX FRUITS/VEGGIES PER DAY, DECREASE INTAKE OF RED MEAT, AND TO INCREASE FISH INTAKE TO TWO DAYS PER WEEK.  MEATS/FISH SHOULD NOT BE FRIED, BAKED OR BROILED IS PREFERABLE.  I SUGGEST WEARING SPF 50 SUNSCREEN ON EXPOSED PARTS AND ESPECIALLY WHEN IN THE DIRECT SUNLIGHT FOR AN EXTENDED PERIOD OF TIME.  PLEASE AVOID FAST FOOD RESTAURANTS AND INCREASE YOUR WATER INTAKE.  - CMP14+EGFR - CBC - Lipid panel - Hemoglobin A1c  2. Essential hypertension, benigh  Fair control. She reports she is no longer taking the medication. She stopped when recall was placed for valsartan. She does not wish to take medications.  She is encouraged to avoid adding salt to her foods.  EKG performed, no new changes noted.  - EKG 12-Lead - POCT Urinalysis Dipstick (81002) - POCT UA - Microalbumin  3. Recent weight gain  She is advised of 9 pound weight gain and encouraged to incorporate more exercise into her daily routine. She is also encouraged to avoid sugary beverages and processed foods.   4. Class 3 severe obesity due to excess calories without serious comorbidity with body mass index (BMI) of 40.0 to 44.9 in adult [facility])  Importance of achieving optimal weight to decrease risk of cardiovascular disease and cancers was discussed with the patient in full detail. She is encouraged to start  slowly - start with 10 minutes twice daily at least three to four days per week and to gradually build to 30 minutes five days weekly. She was given tips to incorporate more activity into her daily routine - take stairs when possible, park farther away from her job, grocery stores, etc.    Maximino Greenland, MD    THE PATIENT IS ENCOURAGED TO PRACTICE SOCIAL DISTANCING DUE TO THE COVID-19 PANDEMIC.

## 2019-04-30 LAB — CBC
Hematocrit: 37.9 % (ref 34.0–46.6)
Hemoglobin: 11.9 g/dL (ref 11.1–15.9)
MCH: 23 pg — ABNORMAL LOW (ref 26.6–33.0)
MCHC: 31.4 g/dL — ABNORMAL LOW (ref 31.5–35.7)
MCV: 73 fL — ABNORMAL LOW (ref 79–97)
Platelets: 299 10*3/uL (ref 150–450)
RBC: 5.18 x10E6/uL (ref 3.77–5.28)
RDW: 17.2 % — ABNORMAL HIGH (ref 11.7–15.4)
WBC: 5.9 10*3/uL (ref 3.4–10.8)

## 2019-04-30 LAB — CMP14+EGFR
ALT: 16 IU/L (ref 0–32)
AST: 16 IU/L (ref 0–40)
Albumin/Globulin Ratio: 1.2 (ref 1.2–2.2)
Albumin: 3.9 g/dL (ref 3.8–4.9)
Alkaline Phosphatase: 85 IU/L (ref 39–117)
BUN/Creatinine Ratio: 14 (ref 9–23)
BUN: 12 mg/dL (ref 6–24)
Bilirubin Total: 0.2 mg/dL (ref 0.0–1.2)
CO2: 24 mmol/L (ref 20–29)
Calcium: 9.7 mg/dL (ref 8.7–10.2)
Chloride: 106 mmol/L (ref 96–106)
Creatinine, Ser: 0.87 mg/dL (ref 0.57–1.00)
GFR calc Af Amer: 85 mL/min/{1.73_m2} (ref >59)
GFR calc non Af Amer: 74 mL/min/{1.73_m2} (ref >59)
Globulin, Total: 3.2 g/dL (ref 1.5–4.5)
Glucose: 107 mg/dL — ABNORMAL HIGH (ref 65–99)
Potassium: 4 mmol/L (ref 3.5–5.2)
Sodium: 144 mmol/L (ref 134–144)
Total Protein: 7.1 g/dL (ref 6.0–8.5)

## 2019-04-30 LAB — HEMOGLOBIN A1C
Est. average glucose Bld gHb Est-mCnc: 126 mg/dL
Hgb A1c MFr Bld: 6 % — ABNORMAL HIGH (ref 4.8–5.6)

## 2019-04-30 LAB — LIPID PANEL
Chol/HDL Ratio: 4 ratio (ref 0.0–4.4)
Cholesterol, Total: 175 mg/dL (ref 100–199)
HDL: 44 mg/dL (ref >39)
LDL Calculated: 110 mg/dL — ABNORMAL HIGH (ref 0–99)
Triglycerides: 107 mg/dL (ref 0–149)
VLDL Cholesterol Cal: 21 mg/dL (ref 5–40)

## 2019-05-01 ENCOUNTER — Telehealth: Payer: Self-pay

## 2019-05-01 NOTE — Telephone Encounter (Signed)
Voicemail not setup.  Letter mailed.

## 2019-05-01 NOTE — Telephone Encounter (Signed)
-----   Message from Glendale Chard, MD sent at 04/30/2019  6:46 PM EDT ----- Labs look ok. LDL, bad chol is 110- goal is less than 100. a1c is 6.0, this is in predm range. Goal is less than 5.6. we will recheck at your next visit. Were you able to drop off your insurance information? We will need to forward this to [place] so your labs can be billed properly.

## 2019-08-03 ENCOUNTER — Telehealth: Payer: Self-pay

## 2019-08-03 NOTE — Telephone Encounter (Signed)
PT LVM TO CANCEL APPT FOR 11/4.ATT TO CONTACT PT TO VERIFY IF SHE WANTED TO RESCHEDULE AND NOTIFY HER THAT IT WAS CANCELLED NO ANS UNABLE TO LVM NO MAILBOX SET UP

## 2019-08-05 ENCOUNTER — Ambulatory Visit: Payer: Self-pay | Admitting: Internal Medicine

## 2020-05-02 ENCOUNTER — Other Ambulatory Visit: Payer: Self-pay

## 2020-05-02 ENCOUNTER — Encounter: Payer: Self-pay | Admitting: Internal Medicine

## 2020-07-28 LAB — BASIC METABOLIC PANEL
BUN: 11 (ref 4–21)
CO2: 24 — AB (ref 13–22)
Chloride: 105 (ref 99–108)
Creatinine: 0.9 (ref 0.5–1.1)
Glucose: 91
Sodium: 142 (ref 137–147)

## 2020-07-28 LAB — COMPREHENSIVE METABOLIC PANEL
Albumin: 4 (ref 3.5–5.0)
Calcium: 10.2 (ref 8.7–10.7)
GFR calc Af Amer: 85
GFR calc non Af Amer: 74

## 2020-07-28 LAB — HEPATIC FUNCTION PANEL
ALT: 15 (ref 7–35)
AST: 15 (ref 13–35)
Alkaline Phosphatase: 91 (ref 25–125)
Bilirubin, Direct: 0.1 (ref 0.01–0.4)
Bilirubin, Total: 0.2

## 2020-07-28 LAB — CBC: RBC: 5.37 — AB (ref 3.87–5.11)

## 2020-07-28 LAB — CBC AND DIFFERENTIAL
HCT: 40 (ref 36–46)
Hemoglobin: 12.3 (ref 12.0–16.0)
Platelets: 363 (ref 150–399)
WBC: 7.2

## 2020-08-05 ENCOUNTER — Encounter: Payer: Self-pay | Admitting: Internal Medicine

## 2020-08-10 ENCOUNTER — Ambulatory Visit (INDEPENDENT_AMBULATORY_CARE_PROVIDER_SITE_OTHER): Payer: PRIVATE HEALTH INSURANCE | Admitting: Nurse Practitioner

## 2020-08-10 ENCOUNTER — Other Ambulatory Visit: Payer: Self-pay

## 2020-08-10 ENCOUNTER — Encounter: Payer: Self-pay | Admitting: Nurse Practitioner

## 2020-08-10 VITALS — BP 122/80 | HR 64 | Temp 98.5°F | Ht 63.4 in | Wt 251.0 lb

## 2020-08-10 DIAGNOSIS — F329 Major depressive disorder, single episode, unspecified: Secondary | ICD-10-CM | POA: Diagnosis not present

## 2020-08-10 DIAGNOSIS — F4321 Adjustment disorder with depressed mood: Secondary | ICD-10-CM

## 2020-08-10 MED ORDER — BUPROPION HCL ER (XL) 150 MG PO TB24: 150.0000 mg | ORAL_TABLET | Freq: Every day | ORAL | 2 refills | Status: AC

## 2020-08-10 NOTE — Progress Notes (Signed)
Rutherford Nail as a scribe for Minette Brine, FNP.,have documented all relevant documentation on the behalf of Minette Brine, FNP,as directed by  Minette Brine, FNP while in the presence of Minette Brine, [place]. This visit occurred during the SARS-CoV-2 public health emergency.  Safety protocols were in place, including screening questions prior to the visit, additional usage of staff PPE, and extensive cleaning of exam room while observing appropriate contact time as indicated for disinfecting solutions.  Subjective:     Patient ID: [name] , female    DOB: 1961-07-27 , 59 y.o.   MRN: [id number]   Chief Complaint  Patient presents with  . Depression    HPI  Since the death of her mom in Jul 23, 2023 she is finding herself in a dark place.  She was also going through some other issues at that time.  She does not feel she has had problems before.  Her daughter feels she     She feels like she is going  She did go to [place] for 3 weeks She has been rescheduling appts.      Past Medical History:  Diagnosis Date  . Anemia   . Asthma    no problems in years   . Heart murmur    as a child   . Hypertension   . Plantar fasciitis      Family History  Problem Relation Age of Onset  . Hypertension Mother   . Thyroid disease Mother   . Gout Mother   . Diabetes Father   . Hypertension Father   . Heart failure Father   . Stroke Other      Current Outpatient Medications:  .  cholecalciferol (VITAMIN D3) 25 MCG (1000 UNIT) tablet, Take 1,000 Units by mouth daily., Disp: , Rfl:  .  ferrous sulfate (FER-IN-SOL) 75 (15 Fe) MG/ML SOLN, Take by mouth., Disp: , Rfl:  .  ibuprofen (ADVIL) 200 MG tablet, Take 200 mg by mouth every 6 (six) hours as needed., Disp: , Rfl:  .  Multiple Vitamins-Minerals (ONE-A-DAY WOMENS 50 PLUS PO), Take by mouth., Disp: , Rfl:  .  NON FORMULARY, cbd oil, Disp: , Rfl:  .  buPROPion (WELLBUTRIN XL) 150 MG 24 hr tablet, Take 1 tablet (150 mg total) by  mouth daily., Disp: 30 tablet, Rfl: 2   No Known Allergies   Review of Systems  Constitutional: Negative.  Negative for fatigue.  HENT: Negative.   Respiratory: Negative.   Endocrine: Negative for polydipsia, polyphagia and polyuria.  Musculoskeletal: Negative.   Skin: Negative.   Neurological: Negative for dizziness and headaches.  Psychiatric/Behavioral: Negative.      Today's Vitals   08/10/20 0920  BP: 122/80  Pulse: 64  Temp: 98.5 F (36.9 C)  TempSrc: Oral  Weight: 251 lb (113.9 kg)  Height: 5' 3.4" (1.61 m)  PainSc: 0-No pain   Body mass index is 43.9 kg/m.   Objective:  Physical Exam Vitals reviewed.  Constitutional:      General: She is not in acute distress.    Appearance: Normal appearance.  Cardiovascular:     Rate and Rhythm: Normal rate and regular rhythm.     Pulses: Normal pulses.     Heart sounds: Normal heart sounds. No murmur heard.   Pulmonary:     Effort: Pulmonary effort is normal. No respiratory distress.     Breath sounds: Normal breath sounds.  Neurological:     General: No focal deficit present.     Mental  Status: She is alert and oriented to person, place, and time.     Cranial Nerves: No cranial nerve deficit.  Psychiatric:        Mood and Affect: Mood normal.        Behavior: Behavior normal.        Thought Content: Thought content normal.        Judgment: Judgment normal.         Assessment And Plan:     1. Reactive depression  This is new, discussed with her the side effects of buproprion and if she has different thoughts to reach out to the office, mental health provider or go to the ER for further evaluation.   I also discussed this with her daughter who was present during the visit. - buPROPion (WELLBUTRIN XL) 150 MG 24 hr tablet; Take 1 tablet (150 mg total) by mouth daily.  Dispense: 30 tablet; Refill: 2 - Ambulatory referral to Psychology  2. Grief reaction  Will refer to grief counseling [facility]  - buPROPion  (WELLBUTRIN XL) 150 MG 24 hr tablet; Take 1 tablet (150 mg total) by mouth daily.  Dispense: 30 tablet; Refill: 2 - Ambulatory referral to Psychology     Patient was given opportunity to ask questions. Patient verbalized understanding of the plan and was able to repeat key elements of the plan. All questions were answered to their satisfaction.    Teola Bradley, FNP, have reviewed all documentation for this visit. The documentation on 08/14/20 for the exam, diagnosis, procedures, and orders are all accurate and complete.   THE PATIENT IS ENCOURAGED TO PRACTICE SOCIAL DISTANCING DUE TO THE COVID-19 PANDEMIC.

## 2020-08-10 NOTE — Patient Instructions (Signed)
Depression Screening Depression screening is a tool that your health care provider can use to learn if you have symptoms of depression. Depression is a common condition with many symptoms that are also often found in other conditions. Depression is treatable, but it must first be diagnosed. You may not know that certain feelings, thoughts, and behaviors that you are having can be symptoms of depression. Taking a depression screening test can help you and your health care provider decide if you need more assessment, or if you should be referred to a mental health care provider. What are the screening tests?  You may have a physical exam to see if another condition is affecting your mental health. You may have a blood or urine sample taken during the physical exam.  You may be interviewed using a screening tool that was developed from research, such as one of these: ? Patient Health Questionnaire (PHQ). This is a set of either 2 or 9 questions. A health care provider who has been trained to score this screening test uses a guide to assess if your symptoms suggest that you may have depression. ? Hamilton Depression Rating Scale (HAM-D). This is a set of either 17 or 24 questions. You may be asked to take it again during or after your treatment, to see if your depression has gotten better. ? Beck Depression Inventory (BDI). This is a set of 21 multiple choice questions. Your health care provider scores your answers to assess:  Your level of depression, ranging from mild to severe.  Your response to treatment.  Your health care provider may talk with you about your daily activities, such as eating, sleeping, work, and recreation, and ask if you have had any changes in activity.  Your health care provider may ask you to see a mental health specialist, such as a psychiatrist or psychologist, for more evaluation. Who should be screened for depression?   All adults, including adults with a family history  of a mental health disorder.  Adolescents who are 12-18 years old.  People who are recovering from a myocardial infarction (MI).  Pregnant women, or women who have given birth.  People who have a long-term (chronic) illness.  Anyone who has been diagnosed with another type of a mental health disorder.  Anyone who has symptoms that could show depression. What do my results mean? Your health care provider will review the results of your depression screening, physical exam, and lab tests. Positive screens suggest that you may have depression. Screening is the first step in getting the care that you may need. It is up to you to get your screening results. Ask your health care provider, or the department that is doing your screening tests, when your results will be ready. Talk with your health care provider about your results and diagnosis. A diagnosis of depression is made using the Diagnostic and Statistical Manual of Mental Disorders (DSM-V). This is a book that lists the number and type of symptoms that must be present for a health care provider to give a specific diagnosis.  Your health care provider may work with you to treat your symptoms of depression, or your health care provider may help you find a mental health provider who can assess, diagnose, and treat your depression. Get help right away if:  You have thoughts about hurting yourself or others. If you ever feel like you may hurt yourself or others, or have thoughts about taking your own life, get help right away. You   can go to your nearest emergency department or call:  Your local emergency services (911 in the [place]  A suicide crisis helpline, such as the National Suicide Prevention Lifeline at 1-800-273-8255. This is open 24 hours a day. Summary  Depression screening is the first step in getting the help that you may need.  If your screening test shows symptoms of depression (is positive), your health care provider may ask  you to see a mental health provider.  Anyone who is age 60 or older should be screened for depression. This information is not intended to replace advice given to you by your health care provider. Make sure you discuss any questions you have with your health care provider. Document Revised: 08/30/2017 Document Reviewed: 02/01/2017 Elsevier Patient Education  2020 [employer] Inc.  

## 2020-09-14 ENCOUNTER — Encounter: Payer: Self-pay | Admitting: Internal Medicine

## 2020-09-14 ENCOUNTER — Other Ambulatory Visit: Payer: Self-pay

## 2020-09-14 ENCOUNTER — Ambulatory Visit (INDEPENDENT_AMBULATORY_CARE_PROVIDER_SITE_OTHER): Payer: PRIVATE HEALTH INSURANCE | Admitting: Internal Medicine

## 2020-09-14 VITALS — BP 144/86 | HR 84 | Temp 98.1°F | Ht 63.4 in | Wt 247.8 lb

## 2020-09-14 DIAGNOSIS — Z6841 Body Mass Index (BMI) 40.0 and over, adult: Secondary | ICD-10-CM

## 2020-09-14 DIAGNOSIS — F329 Major depressive disorder, single episode, unspecified: Secondary | ICD-10-CM | POA: Diagnosis not present

## 2020-09-14 DIAGNOSIS — I10 Essential (primary) hypertension: Secondary | ICD-10-CM | POA: Diagnosis not present

## 2020-09-14 NOTE — Progress Notes (Signed)
I,Katawbba Wiggins,acting as a Education administrator for Maximino Greenland, MD.,have documented all relevant documentation on the behalf of Maximino Greenland, MD,as directed by  Maximino Greenland, MD while in the presence of Maximino Greenland, MD.  This visit occurred during the SARS-CoV-2 public health emergency.  Safety protocols were in place, including screening questions prior to the visit, additional usage of staff PPE, and extensive cleaning of exam room while observing appropriate contact time as indicated for disinfecting solutions.  Subjective:     Patient ID: [name] , female    DOB: 07-26-1961 , 59 y.o.   MRN: [id number]   No chief complaint on file.   HPI  The patient is here today for a follow-up on depression. She was started on Wellbutrin at her last visit by Doreene Burke, NP. This was started to help with her grief. Her mother unfortunately passed in Sept 2021. She is still having difficulty dealing with her untimely death.     Past Medical History:  Diagnosis Date  . Anemia   . Asthma    no problems in years   . Heart murmur    as a child   . Hypertension   . Plantar fasciitis      Family History  Problem Relation Age of Onset  . Hypertension Mother   . Thyroid disease Mother   . Gout Mother   . Diabetes Father   . Hypertension Father   . Heart failure Father   . Stroke Other      Current Outpatient Medications:  .  buPROPion (WELLBUTRIN XL) 150 MG 24 hr tablet, Take 1 tablet (150 mg total) by mouth daily., Disp: 30 tablet, Rfl: 2 .  cholecalciferol (VITAMIN D3) 25 MCG (1000 UNIT) tablet, Take 1,000 Units by mouth daily., Disp: , Rfl:  .  ferrous sulfate (FER-IN-SOL) 75 (15 Fe) MG/ML SOLN, Take by mouth., Disp: , Rfl:  .  ibuprofen (ADVIL) 200 MG tablet, Take 200 mg by mouth every 6 (six) hours as needed., Disp: , Rfl:  .  Multiple Vitamins-Minerals (ONE-A-DAY WOMENS 50 PLUS PO), Take by mouth., Disp: , Rfl:  .  NON FORMULARY, cbd oil, Disp: , Rfl:    No Known Allergies    Review of Systems  Constitutional: Negative.   Respiratory: Negative.   Cardiovascular: Negative.   Gastrointestinal: Negative.   Psychiatric/Behavioral: Negative.   All other systems reviewed and are negative.    Today's Vitals   09/14/20 1234  BP: (!) 144/86  Pulse: 84  Temp: 98.1 F (36.7 C)  TempSrc: Oral  Weight: 247 lb 12.8 oz (112.4 kg)  Height: 5' 3.4" (1.61 m)   Body mass index is 43.34 kg/m.  Wt Readings from Last 3 Encounters:  09/14/20 247 lb 12.8 oz (112.4 kg)  08/10/20 251 lb (113.9 kg)  04/29/19 243 lb (110.2 kg)   Objective:  Physical Exam Vitals and nursing note reviewed.  Constitutional:      Appearance: Normal appearance. She is obese.  HENT:     Head: Normocephalic and atraumatic.  Cardiovascular:     Rate and Rhythm: Normal rate and regular rhythm.     Heart sounds: Normal heart sounds.  Pulmonary:     Breath sounds: Normal breath sounds.  Skin:    General: Skin is warm.  Neurological:     General: No focal deficit present.     Mental Status: She is alert and oriented to person, place, and time.     Assessment And Plan:  1. Reactive depression Comments: She is encouraged to continue with Wellbutrin and consider grief counseling. She will f/u in 3 months for re-evaluation.   2. Essential hypertension, benign Comments: Chronic, uncontrolled. She has been on BP meds in the past, but she stopped taking them. States her BP improved. She agrees to f/u in 2 weeks for nurse visit.   3. Class 3 severe obesity due to excess calories without serious comorbidity with body mass index (BMI) of 40.0 to 44.9 in adult [facility]) Comments: BMI 43. She is encouraged to strive for BMI less than 30 to decrease cardiac risk. Advised to aim for at least 150 minutes of exercise per week.   Patient was given opportunity to ask questions. Patient verbalized understanding of the plan and was able to repeat key elements of the plan. All questions were answered to  their satisfaction.  Maximino Greenland, MD   I, Maximino Greenland, MD, have reviewed all documentation for this visit. The documentation on 09/25/20 for the exam, diagnosis, procedures, and orders are all accurate and complete.  THE PATIENT IS ENCOURAGED TO PRACTICE SOCIAL DISTANCING DUE TO THE COVID-19 PANDEMIC.

## 2020-09-14 NOTE — Patient Instructions (Signed)
Preventing Hypertension Hypertension, commonly called high blood pressure, is when the force of blood pumping through the arteries is too strong. Arteries are blood vessels that carry blood from the heart throughout the body. Over time, hypertension can damage the arteries and decrease blood flow to important parts of the body, including the brain, heart, and kidneys. Often, hypertension does not cause symptoms until blood pressure is very high. For this reason, it is important to have your blood pressure checked on a regular basis. Hypertension can often be prevented with diet and lifestyle changes. If you already have hypertension, you can control it with diet and lifestyle changes, as well as medicine. What nutrition changes can be made? Maintain a healthy diet. This includes:  Eating less salt (sodium). Ask your health care provider how much sodium is safe for you to have. The general recommendation is to consume less than 1 tsp (2,300 mg) of sodium a day. ? Do not add salt to your food. ? Choose low-sodium options when grocery shopping and eating out.  Limiting fats in your diet. You can do this by eating low-fat or fat-free dairy products and by eating less red meat.  Eating more fruits, vegetables, and whole grains. Make a goal to eat: ? 1-2 cups of fresh fruits and vegetables each day. ? 3-4 servings of whole grains each day.  Avoiding foods and beverages that have added sugars.  Eating fish that contain healthy fats (omega-3 fatty acids), such as mackerel or salmon. If you need help putting together a healthy eating plan, try the DASH diet. This diet is high in fruits, vegetables, and whole grains. It is low in sodium, red meat, and added sugars. DASH stands for Dietary Approaches to Stop Hypertension. What lifestyle changes can be made?   Lose weight if you are overweight. Losing just 3?5% of your body weight can help prevent or control hypertension. ? For example, if your present  weight is 200 lb (91 kg), a loss of 3-5% of your weight means losing 6-10 lb (2.7-4.5 kg). ? Ask your health care provider to help you with a diet and exercise plan to safely lose weight.  Get enough exercise. Do at least 150 minutes of moderate-intensity exercise each week. ? You could do this in short exercise sessions several times a day, or you could do longer exercise sessions a few times a week. For example, you could take a brisk 10-minute walk or bike ride, 3 times a day, for 5 days a week.  Find ways to reduce stress, such as exercising, meditating, listening to music, or taking a yoga class. If you need help reducing stress, ask your health care provider.  Do not smoke. This includes e-cigarettes. Chemicals in tobacco and nicotine products raise your blood pressure each time you smoke. If you need help quitting, ask your health care provider.  Avoid alcohol. If you drink alcohol, limit alcohol intake to no more than 1 drink a day for nonpregnant women and 2 drinks a day for men. One drink equals 12 oz of beer, 5 oz of wine, or 1 oz of hard liquor. Why are these changes important? Diet and lifestyle changes can help you prevent hypertension, and they may make you feel better overall and improve your quality of life. If you have hypertension, making these changes will help you control it and help prevent major complications, such as:  Hardening and narrowing of arteries that supply blood to: ? Your heart. This can cause a heart  attack. ? Your brain. This can cause a stroke. ? Your kidneys. This can cause kidney failure.  Stress on your heart muscle, which can cause heart failure. What can I do to lower my risk?  Work with your health care provider to make a hypertension prevention plan that works for you. Follow your plan and keep all follow-up visits as told by your health care provider.  Learn how to check your blood pressure at home. Make sure that you know your personal target  blood pressure, as told by your health care provider. How is this treated? In addition to diet and lifestyle changes, your health care provider may recommend medicines to help lower your blood pressure. You may need to try a few different medicines to find what works best for you. You also may need to take more than one medicine. Take over-the-counter and prescription medicines only as told by your health care provider. Where to find support Your health care provider can help you prevent hypertension and help you keep your blood pressure at a healthy level. Your local hospital or your community may also provide support services and prevention programs. The American Heart Association offers an online support network at: CheapBootlegs.com.cy Where to find more information Learn more about hypertension from:  [place], Lung, and Blood Institute: ElectronicHangman.is  Centers for Disease Control and Prevention: https://ingram.com/  American Academy of Family Physicians: http://familydoctor.org/familydoctor/en/diseases-conditions/high-blood-pressure.printerview.all.html Learn more about the DASH diet from:  [place], Lung, and [place]: https://www.reyes.com/ Contact a health care provider if:  You think you are having a reaction to medicines you have taken.  You have recurrent headaches or feel dizzy.  You have swelling in your ankles.  You have trouble with your vision. Summary  Hypertension often does not cause any symptoms until blood pressure is very high. It is important to get your blood pressure checked regularly.  Diet and lifestyle changes are the most important steps in preventing hypertension.  By keeping your blood pressure in a healthy range, you can prevent complications like heart attack, heart failure, stroke, and kidney failure.  Work with your health care  provider to make a hypertension prevention plan that works for you. This information is not intended to replace advice given to you by your health care provider. Make sure you discuss any questions you have with your health care provider. Document Revised: 01/09/2019 Document Reviewed: 05/28/2016 Elsevier Patient Education  2020 [employer].

## 2020-10-11 ENCOUNTER — Other Ambulatory Visit: Payer: Self-pay

## 2020-10-11 ENCOUNTER — Ambulatory Visit: Payer: PRIVATE HEALTH INSURANCE

## 2020-10-11 VITALS — BP 118/80 | HR 67 | Temp 98.2°F | Ht 63.0 in | Wt 246.4 lb

## 2020-10-11 DIAGNOSIS — I10 Essential (primary) hypertension: Secondary | ICD-10-CM

## 2020-10-11 NOTE — Progress Notes (Signed)
Pt is here today for a bp reading. Blood pressure reading normal, provider advised her to start 400mg  magnesium nightly, calm brand.

## 2020-10-12 LAB — HM COLONOSCOPY

## 2020-10-14 ENCOUNTER — Encounter: Payer: Self-pay | Admitting: Internal Medicine

## 2020-12-13 ENCOUNTER — Encounter: Payer: Self-pay | Admitting: Internal Medicine

## 2020-12-13 ENCOUNTER — Other Ambulatory Visit: Payer: Self-pay

## 2020-12-13 ENCOUNTER — Ambulatory Visit (INDEPENDENT_AMBULATORY_CARE_PROVIDER_SITE_OTHER): Payer: PRIVATE HEALTH INSURANCE | Admitting: Internal Medicine

## 2020-12-13 VITALS — BP 132/78 | HR 75 | Temp 98.2°F | Ht 63.0 in | Wt 252.2 lb

## 2020-12-13 DIAGNOSIS — I10 Essential (primary) hypertension: Secondary | ICD-10-CM

## 2020-12-13 DIAGNOSIS — R0989 Other specified symptoms and signs involving the circulatory and respiratory systems: Secondary | ICD-10-CM | POA: Diagnosis not present

## 2020-12-13 DIAGNOSIS — E2839 Other primary ovarian failure: Secondary | ICD-10-CM

## 2020-12-13 DIAGNOSIS — Z6841 Body Mass Index (BMI) 40.0 and over, adult: Secondary | ICD-10-CM

## 2020-12-13 DIAGNOSIS — J4520 Mild intermittent asthma, uncomplicated: Secondary | ICD-10-CM

## 2020-12-13 DIAGNOSIS — F329 Major depressive disorder, single episode, unspecified: Secondary | ICD-10-CM

## 2020-12-13 DIAGNOSIS — Z1239 Encounter for other screening for malignant neoplasm of breast: Secondary | ICD-10-CM

## 2020-12-13 MED ORDER — ALBUTEROL SULFATE HFA 108 (90 BASE) MCG/ACT IN AERS
1.0000 | INHALATION_SPRAY | Freq: Four times a day (QID) | RESPIRATORY_TRACT | 11 refills | Status: AC | PRN
Start: 2020-12-13 — End: ?

## 2020-12-13 NOTE — Patient Instructions (Signed)

## 2020-12-13 NOTE — Progress Notes (Signed)
I,Katawbba Wiggins,acting as a Education administrator for Maximino Greenland, MD.,have documented all relevant documentation on the behalf of Maximino Greenland, MD,as directed by  Maximino Greenland, MD while in the presence of Maximino Greenland, MD.  This visit occurred during the SARS-CoV-2 public health emergency.  Safety protocols were in place, including screening questions prior to the visit, additional usage of staff PPE, and extensive cleaning of exam room while observing appropriate contact time as indicated for disinfecting solutions.  Subjective:     Patient ID: [name] , female    DOB: Feb 22, 1961 , 60 y.o.   MRN: [id number]   Chief Complaint  Patient presents with  . Hypertension  . Depression    HPI  The patient is here today for a follow-up on her blood pressure and depression. She is not taking any bp meds at this time, prefers to not take meds. She denies headaches, chest pain and shortness of breath.   Since her last visit, she has stopped the Wellbutrin. She no longer feels she needs it.   Hypertension This is a chronic problem. The current episode started more than 1 year ago. The problem has been gradually improving since onset. The problem is controlled. Pertinent negatives include no blurred vision, chest pain, palpitations or shortness of breath. The current treatment provides moderate improvement. Compliance problems include exercise.      Past Medical History:  Diagnosis Date  . Anemia   . Asthma    no problems in years   . Heart murmur    as a child   . Hypertension   . Plantar fasciitis      Family History  Problem Relation Age of Onset  . Hypertension Mother   . Thyroid disease Mother   . Gout Mother   . Diabetes Father   . Hypertension Father   . Heart failure Father   . Stroke Other      Current Outpatient Medications:  .  albuterol (PROAIR HFA) 108 (90 Base) MCG/ACT inhaler, Inhale 1-2 puffs into the lungs every 6 (six) hours as needed for wheezing or  shortness of breath., Disp: 18 g, Rfl: 11 .  cholecalciferol (VITAMIN D3) 25 MCG (1000 UNIT) tablet, Take 1,000 Units by mouth daily., Disp: , Rfl:  .  ferrous sulfate (FER-IN-SOL) 75 (15 Fe) MG/ML SOLN, Take by mouth., Disp: , Rfl:  .  ibuprofen (ADVIL) 200 MG tablet, Take 200 mg by mouth every 6 (six) hours as needed., Disp: , Rfl:  .  Multiple Vitamins-Minerals (ONE-A-DAY WOMENS 50 PLUS PO), Take by mouth., Disp: , Rfl:  .  NON FORMULARY, cbd oil, Disp: , Rfl:  .  buPROPion (WELLBUTRIN XL) 150 MG 24 hr tablet, Take 1 tablet (150 mg total) by mouth daily. (Patient not taking: No sig reported), Disp: 30 tablet, Rfl: 2   No Known Allergies   Review of Systems  Constitutional: Negative.   Eyes: Negative for blurred vision.  Respiratory: Negative.  Negative for shortness of breath.   Cardiovascular: Negative.  Negative for chest pain and palpitations.  Gastrointestinal: Negative.   All other systems reviewed and are negative.    Today's Vitals   12/13/20 1218  BP: 132/78  Pulse: 75  Temp: 98.2 F (36.8 C)  TempSrc: Oral  Weight: 252 lb 3.2 oz (114.4 kg)  Height: 5\' 3"  (1.6 m)   Body mass index is 44.68 kg/m.  Wt Readings from Last 3 Encounters:  12/13/20 252 lb 3.2 oz (114.4 kg)  10/11/20  246 lb 6.4 oz (111.8 kg)  09/14/20 247 lb 12.8 oz (112.4 kg)   Objective:  Physical Exam Vitals and nursing note reviewed.  Constitutional:      Appearance: Normal appearance. She is obese.  HENT:     Head: Normocephalic and atraumatic.     Nose:     Comments: Masked     Mouth/Throat:     Comments: Masked  Cardiovascular:     Rate and Rhythm: Normal rate and regular rhythm.     Pulses:          Carotid pulses are on the left side with bruit.    Heart sounds: Normal heart sounds.  Pulmonary:     Breath sounds: Normal breath sounds.  Musculoskeletal:     Cervical back: Normal range of motion.  Skin:    General: Skin is warm.  Neurological:     General: No focal deficit present.      Mental Status: She is alert and oriented to person, place, and time.         Assessment And Plan:     1. Essential hypertension, benign Comments: Chronic, fair control, off meds.  She is encouraged to use salt-free seasonings. Also advised to incorporate more exercise into her daily routine.  She was reminded optimal BP is less than 120/80. Encouraged to monitor her bp at home.   2. Reactive depression Comments: She stopped taking Wellbutrin. She reports her sx have improved and she no longer needs the medication.   3. Estrogen deficiency Comments: I will refer her for bone density. Will try to schedule same day as her mammogram.  - DG Bone Density; Future  4. Left carotid bruit Comments: I will refer her for b/l carotid ultrasound.  - US Carotid Duplex Bilateral; Future  5. Mild intermittent asthma without complication Comments: She requests albuterol inhaler to use prn.   6. Class 3 severe obesity due to excess calories without serious comorbidity with body mass index (BMI) of 40.0 to 44.9 in adult [facility]) Comments: BMI 44. Advised to initially strive for BMI less than 38 to decrease cardiac risk. Encouraged to incorporate at least 150 minutes of exercise per week.  7. Encounter for screening for malignant neoplasm of breast, unspecified screening modality Comments: She agrees to referral to [place] for mammogram.  - MM Digital Screening; Future  Patient was given opportunity to ask questions. Patient verbalized understanding of the plan and was able to repeat key elements of the plan. All questions were answered to their satisfaction.   I, Maximino Greenland, MD, have reviewed all documentation for this visit. The documentation on 12/13/20 for the exam, diagnosis, procedures, and orders are all accurate and complete.   IF YOU HAVE BEEN REFERRED TO A SPECIALIST, IT MAY TAKE 1-2 WEEKS TO SCHEDULE/PROCESS THE REFERRAL. IF YOU HAVE NOT HEARD FROM US/SPECIALIST IN TWO WEEKS,  PLEASE GIVE [place] A CALL AT [phone].   THE PATIENT IS ENCOURAGED TO PRACTICE SOCIAL DISTANCING DUE TO THE COVID-19 PANDEMIC.

## 2020-12-22 ENCOUNTER — Ambulatory Visit
Admission: RE | Admit: 2020-12-22 | Discharge: 2020-12-22 | Disposition: A | Discharge status: Home / Self Care | Payer: PRIVATE HEALTH INSURANCE | Source: Ambulatory Visit | Attending: Internal Medicine | Admitting: Internal Medicine

## 2020-12-22 DIAGNOSIS — R0989 Other specified symptoms and signs involving the circulatory and respiratory systems: Secondary | ICD-10-CM

## 2020-12-30 ENCOUNTER — Other Ambulatory Visit: Payer: Self-pay

## 2020-12-30 ENCOUNTER — Ambulatory Visit: Payer: PRIVATE HEALTH INSURANCE

## 2020-12-30 VITALS — BP 140/78 | HR 69

## 2020-12-30 DIAGNOSIS — I10 Essential (primary) hypertension: Secondary | ICD-10-CM

## 2020-12-30 NOTE — Progress Notes (Signed)
Pt here today for a b/p check in both arms

## 2021-04-17 ENCOUNTER — Ambulatory Visit: Payer: PRIVATE HEALTH INSURANCE | Admitting: Internal Medicine

## 2021-06-08 ENCOUNTER — Ambulatory Visit
Admission: RE | Admit: 2021-06-08 | Discharge: 2021-06-08 | Disposition: A | Discharge status: Home / Self Care | Payer: PRIVATE HEALTH INSURANCE | Source: Ambulatory Visit | Attending: Internal Medicine | Admitting: Internal Medicine

## 2021-06-08 ENCOUNTER — Other Ambulatory Visit: Payer: Self-pay

## 2021-06-08 DIAGNOSIS — E2839 Other primary ovarian failure: Secondary | ICD-10-CM

## 2021-06-08 DIAGNOSIS — Z1239 Encounter for other screening for malignant neoplasm of breast: Secondary | ICD-10-CM

## 2021-08-21 ENCOUNTER — Encounter: Payer: PRIVATE HEALTH INSURANCE | Admitting: Internal Medicine

## 2022-04-30 ENCOUNTER — Ambulatory Visit (INDEPENDENT_AMBULATORY_CARE_PROVIDER_SITE_OTHER): Payer: 59 | Admitting: Family Medicine

## 2022-04-30 ENCOUNTER — Encounter: Payer: Self-pay | Admitting: Family Medicine

## 2022-04-30 VITALS — BP 170/99 | HR 64 | Temp 97.5°F | Ht 64.0 in | Wt 248.0 lb

## 2022-04-30 DIAGNOSIS — I10 Essential (primary) hypertension: Secondary | ICD-10-CM

## 2022-04-30 LAB — POC URINALSYSI DIPSTICK (AUTOMATED)
Blood, UA: NEGATIVE
Glucose, UA: NEGATIVE
Ketones, UA: NEGATIVE
Leukocytes, UA: NEGATIVE
Nitrite, UA: NEGATIVE
Protein, UA: POSITIVE — AB
Spec Grav, UA: 1.025 (ref 1.010–1.025)
Urobilinogen, UA: 0.2 E.U./dL
pH, UA: 5 (ref 5.0–8.0)

## 2022-04-30 MED ORDER — CHLORTHALIDONE 25 MG PO TABS: 25.0000 mg | ORAL_TABLET | Freq: Every day | ORAL | 2 refills | Status: DC

## 2022-04-30 NOTE — Progress Notes (Signed)
New Patient Office Visit  Subjective    Patient ID: [name], female    DOB: [birth year]  Age: 61 y.o. MRN: [id number]  CC:  Chief Complaint  Patient presents with   Establish Care    C/O htn.    HPI [name] presents to establish care patient has a concern involving her blood pressure which is elevated.  Patient has not had any visits with her primary care doctor or treatment for prior elevated blood pressure in 2 years.  She states that she left her last PCP due to differences about integrative medical care.  Patient is not currently on any blood pressure medications.  No lab work in the last 2 years.  Patient is unsure of the medication she was on prior.  Patient denies any chest pain shortness of breath.  She does have some intermittent right leg swelling that gets better at night worse during the day.  Patient also notes that her blood pressure is been worse for at least the last 2 months.  Patient has no history of heart disease or stroke.  Has no family history of heart attack and stroke.  Does have significant history of multifamily orders having high blood pressure.   Outpatient Encounter Medications as of 04/30/2022  Medication Sig   chlorthalidone (HYGROTON) 25 MG tablet Take 1 tablet (25 mg total) by mouth daily.   albuterol (PROAIR HFA) 108 (90 Base) MCG/ACT inhaler Inhale 1-2 puffs into the lungs every 6 (six) hours as needed for wheezing or shortness of breath.   buPROPion (WELLBUTRIN XL) 150 MG 24 hr tablet Take 1 tablet (150 mg total) by mouth daily. (Patient not taking: Reported on 10/11/2020)   cholecalciferol (VITAMIN D3) 25 MCG (1000 UNIT) tablet Take 1,000 Units by mouth daily.   ferrous sulfate (FER-IN-SOL) 75 (15 Fe) MG/ML SOLN Take by mouth.   ibuprofen (ADVIL) 200 MG tablet Take 200 mg by mouth every 6 (six) hours as needed. (Patient not taking: Reported on 04/30/2022)   Multiple Vitamins-Minerals (ONE-A-DAY WOMENS 50 PLUS PO) Take by mouth.   NON  FORMULARY cbd oil   No facility-administered encounter medications on file as of 04/30/2022.    Past Medical History:  Diagnosis Date   Anemia    Asthma    no problems in years    Heart murmur    as a child    Hypertension    Plantar fasciitis     Past Surgical History:  Procedure Laterality Date   ABDOMINAL HYSTERECTOMY     CESAREAN SECTION      Family History  Problem Relation Age of Onset   Hypertension Mother    Thyroid disease Mother    Gout Mother    Diabetes Father    Hypertension Father    Heart failure Father    Stroke Other     Social History   Socioeconomic History   Marital status: Married    Spouse name: Not on file   Number of children: 4   Years of education: Not on file   Highest education level: Not on file  Occupational History   Not on file  Tobacco Use   Smoking status: Every Day    Packs/day: 0.25    Years: 42.00    Total pack years: 10.50    Types: Cigarettes    Start date: 11/26/1977   Smokeless tobacco: Never   Tobacco comments:    she is feeling an urge to smoke  Vaping Use  Vaping Use: Never used  Substance and Sexual Activity   Alcohol use: Yes    Comment: socially   Drug use: No    Types: Marijuana    Comment: hx of marijuana use 4 years ago    Sexual activity: Yes    Birth control/protection: Condom  Other Topics Concern   Not on file  Social History Narrative   Not on file   Social Determinants of Health   Financial Resource Strain: Not on file  Food Insecurity: Not on file  Transportation Needs: Not on file  Physical Activity: Not on file  Stress: Not on file  Social Connections: Not on file  Intimate Partner Violence: Not on file    Review of Systems  Constitutional:  Negative for chills and fever.  Respiratory:  Negative for shortness of breath.   Cardiovascular:  Negative for palpitations, orthopnea and PND.  All other systems reviewed and are negative.       Objective    BP (!) 170/99   Pulse  64   Temp (!) 97.5 F (36.4 C)   Ht '5\' 4"'$  (1.626 m)   Wt 248 lb (112.5 kg)   LMP 03/26/2012   BMI 42.57 kg/m   Physical Exam Vitals and nursing note reviewed.  Constitutional:      General: She is not in acute distress.    Appearance: Normal appearance. She is not ill-appearing.  HENT:     Head: Normocephalic and atraumatic.     Mouth/Throat:     Mouth: Mucous membranes are moist.     Pharynx: Oropharynx is clear.  Eyes:     Extraocular Movements: Extraocular movements intact.     Pupils: Pupils are equal, round, and reactive to light.  Neck:     Thyroid: No thyroid mass, thyromegaly or thyroid tenderness.     Trachea: Trachea and phonation normal.  Cardiovascular:     Rate and Rhythm: Normal rate and regular rhythm.     Pulses: Normal pulses.     Heart sounds: Normal heart sounds. No murmur heard.    No friction rub. No gallop.  Pulmonary:     Effort: Pulmonary effort is normal.     Breath sounds: Normal breath sounds.  Abdominal:     General: Abdomen is flat.     Palpations: Abdomen is soft.     Tenderness: There is no abdominal tenderness.  Musculoskeletal:     Cervical back: Normal range of motion and neck supple. No rigidity or tenderness.  Skin:    General: Skin is warm.     Capillary Refill: Capillary refill takes less than 2 seconds.     Findings: No rash.  Neurological:     General: No focal deficit present.     Mental Status: She is alert and oriented to person, place, and time.  Psychiatric:        Mood and Affect: Mood normal.        Behavior: Behavior normal.        Assessment & Plan:   Problem List Items Addressed This Visit   None Visit Diagnoses     Uncontrolled stage 2 hypertension    -  Primary   Relevant Medications   chlorthalidone (HYGROTON) 25 MG tablet   Other Relevant Orders   CBC with Differential/Platelet   Comprehensive metabolic panel   Lipid Panel w/o Chol/HDL Ratio   TSH + free T4   Hgb A1c w/o eAG   POCT Urinalysis  Dipstick (Automated)  Urine Microalbumin w/creat. ratio      -Checking labs above, patient to come back in for repeat lab check to check on potassium and sodium in 2 weeks.  We will also get vitals at this time.  We will follow-up with patient regarding her blood pressure within the next 8 weeks.  I gave patient a handout on the medication which included potential side effects and other information.  Patient reviewed this while in the office.  Patient's EKG benign.  Urine taken here only shows trace protein but is highly concentrated.  We will send her urine for microalbumin creatinine ratio.  Thyroid exam benign.  Advised patient that she will need follow-up visit for annual well check which will include screening and preventative counseling and testing ordered as appropriate she voiced understanding.  All questions answered.  EKG: there are no previous tracings (to 01/2012) available for comparison, normal sinus rhythm, nl axis, LAE, QRS 174, rate 59, sinus bradycardia, no stemi.   No follow-ups on file.   Elwin Mocha, MD

## 2022-05-01 LAB — LIPID PANEL W/O CHOL/HDL RATIO
Cholesterol, Total: 168 mg/dL (ref 100–199)
HDL: 49 mg/dL (ref >39)
LDL Chol Calc (NIH): 97 mg/dL (ref 0–99)
Triglycerides: 124 mg/dL (ref 0–149)
VLDL Cholesterol Cal: 22 mg/dL (ref 5–40)

## 2022-05-01 LAB — CBC WITH DIFFERENTIAL/PLATELET
Basophils Absolute: 0 10*3/uL (ref 0.0–0.2)
Basos: 1 %
EOS (ABSOLUTE): 0.3 10*3/uL (ref 0.0–0.4)
Eos: 4 %
Hematocrit: 40.5 % (ref 34.0–46.6)
Hemoglobin: 12.4 g/dL (ref 11.1–15.9)
Immature Grans (Abs): 0 10*3/uL (ref 0.0–0.1)
Immature Granulocytes: 0 %
Lymphocytes Absolute: 2.5 10*3/uL (ref 0.7–3.1)
Lymphs: 36 %
MCH: 23.9 pg — ABNORMAL LOW (ref 26.6–33.0)
MCHC: 30.6 g/dL — ABNORMAL LOW (ref 31.5–35.7)
MCV: 78 fL — ABNORMAL LOW (ref 79–97)
Monocytes Absolute: 0.4 10*3/uL (ref 0.1–0.9)
Monocytes: 6 %
Neutrophils Absolute: 3.7 10*3/uL (ref 1.4–7.0)
Neutrophils: 53 %
Platelets: 325 10*3/uL (ref 150–450)
RBC: 5.18 x10E6/uL (ref 3.77–5.28)
RDW: 17.4 % — ABNORMAL HIGH (ref 11.7–15.4)
WBC: 6.9 10*3/uL (ref 3.4–10.8)

## 2022-05-01 LAB — COMPREHENSIVE METABOLIC PANEL
ALT: 16 IU/L (ref 0–32)
AST: 18 IU/L (ref 0–40)
Albumin/Globulin Ratio: 1.3 (ref 1.2–2.2)
Albumin: 4 g/dL (ref 3.9–4.9)
Alkaline Phosphatase: 78 IU/L (ref 44–121)
BUN/Creatinine Ratio: 13 (ref 12–28)
BUN: 11 mg/dL (ref 8–27)
Bilirubin Total: <0.2 mg/dL (ref 0.0–1.2)
CO2: 23 mmol/L (ref 20–29)
Calcium: 9.6 mg/dL (ref 8.7–10.3)
Chloride: 105 mmol/L (ref 96–106)
Creatinine, Ser: 0.82 mg/dL (ref 0.57–1.00)
Globulin, Total: 3.2 g/dL (ref 1.5–4.5)
Glucose: 86 mg/dL (ref 70–99)
Potassium: 4.2 mmol/L (ref 3.5–5.2)
Sodium: 142 mmol/L (ref 134–144)
Total Protein: 7.2 g/dL (ref 6.0–8.5)
eGFR: 81 mL/min/{1.73_m2} (ref >59)

## 2022-05-01 LAB — MICROALBUMIN / CREATININE URINE RATIO
Creatinine, Urine: 317.8 mg/dL
Microalb/Creat Ratio: 6 mg/g creat (ref 0–29)
Microalbumin, Urine: 17.6 ug/mL

## 2022-05-01 LAB — HGB A1C W/O EAG: Hgb A1c MFr Bld: 6 % — ABNORMAL HIGH (ref 4.8–5.6)

## 2022-05-01 LAB — TSH+FREE T4
Free T4: 1.06 ng/dL (ref 0.82–1.77)
TSH: 3 u[IU]/mL (ref 0.450–4.500)

## 2022-05-15 ENCOUNTER — Ambulatory Visit: Payer: Self-pay | Admitting: Family Medicine

## 2022-05-15 ENCOUNTER — Encounter: Payer: Self-pay | Admitting: Family Medicine

## 2022-05-15 VITALS — BP 126/86 | HR 69 | Resp 16

## 2022-05-15 DIAGNOSIS — I10 Essential (primary) hypertension: Secondary | ICD-10-CM

## 2022-05-16 LAB — COMPREHENSIVE METABOLIC PANEL
ALT: 15 IU/L (ref 0–32)
AST: 16 IU/L (ref 0–40)
Albumin/Globulin Ratio: 1.1 — ABNORMAL LOW (ref 1.2–2.2)
Albumin: 3.8 g/dL — ABNORMAL LOW (ref 3.9–4.9)
Alkaline Phosphatase: 79 IU/L (ref 44–121)
BUN/Creatinine Ratio: 16 (ref 12–28)
BUN: 14 mg/dL (ref 8–27)
Bilirubin Total: 0.2 mg/dL (ref 0.0–1.2)
CO2: 25 mmol/L (ref 20–29)
Calcium: 10 mg/dL (ref 8.7–10.3)
Chloride: 105 mmol/L (ref 96–106)
Creatinine, Ser: 0.89 mg/dL (ref 0.57–1.00)
Globulin, Total: 3.6 g/dL (ref 1.5–4.5)
Glucose: 98 mg/dL (ref 70–99)
Potassium: 3.8 mmol/L (ref 3.5–5.2)
Sodium: 143 mmol/L (ref 134–144)
Total Protein: 7.4 g/dL (ref 6.0–8.5)
eGFR: 74 mL/min/{1.73_m2} (ref >59)

## 2022-06-27 ENCOUNTER — Ambulatory Visit: Payer: Self-pay | Admitting: Family Medicine

## 2022-07-23 ENCOUNTER — Other Ambulatory Visit: Payer: Self-pay | Admitting: Family Medicine

## 2022-07-23 DIAGNOSIS — I10 Essential (primary) hypertension: Secondary | ICD-10-CM

## 2022-08-03 NOTE — Telephone Encounter (Signed)
Kathleene Hazel RMA Medication refilled per verbal form Dr. Aggie Moats

## 2022-09-27 ENCOUNTER — Encounter (HOSPITAL_COMMUNITY): Payer: Self-pay

## 2022-09-27 ENCOUNTER — Emergency Department (HOSPITAL_COMMUNITY)
Admission: EM | Admit: 2022-09-27 | Discharge: 2022-09-27 | Disposition: A | Discharge status: Home / Self Care | Payer: PRIVATE HEALTH INSURANCE | Attending: Emergency Medicine | Admitting: Emergency Medicine

## 2022-09-27 ENCOUNTER — Other Ambulatory Visit: Payer: Self-pay

## 2022-09-27 DIAGNOSIS — Z1152 Encounter for screening for COVID-19: Secondary | ICD-10-CM | POA: Insufficient documentation

## 2022-09-27 DIAGNOSIS — I10 Essential (primary) hypertension: Secondary | ICD-10-CM | POA: Insufficient documentation

## 2022-09-27 DIAGNOSIS — J101 Influenza due to other identified influenza virus with other respiratory manifestations: Secondary | ICD-10-CM | POA: Insufficient documentation

## 2022-09-27 DIAGNOSIS — J45909 Unspecified asthma, uncomplicated: Secondary | ICD-10-CM | POA: Insufficient documentation

## 2022-09-27 LAB — RESP PANEL BY RT-PCR (RSV, FLU A&B, COVID)  RVPGX2
Influenza A by PCR: POSITIVE — AB
Influenza B by PCR: NEGATIVE
Resp Syncytial Virus by PCR: NEGATIVE
SARS Coronavirus 2 by RT PCR: NEGATIVE

## 2022-09-27 MED ORDER — ONDANSETRON HCL 4 MG PO TABS: 4.0000 mg | ORAL_TABLET | Freq: Four times a day (QID) | ORAL | 0 refills | Status: AC | PRN

## 2022-09-27 MED ORDER — ONDANSETRON HCL 4 MG PO TABS: 4.0000 mg | ORAL_TABLET | Freq: Four times a day (QID) | ORAL | 0 refills | Status: DC | PRN

## 2022-09-27 NOTE — ED Triage Notes (Signed)
Pt reports with fever, chills, weakness, and vomiting since Saturday.

## 2022-09-27 NOTE — Discharge Instructions (Addendum)
Return to the ED with any new or worsening signs or symptoms Please begin taking ibuprofen for body aches and chills, Tylenol for headaches and fevers.  You may also purchase Zicam to shorten the duration of your cold symptoms.  You may also consider purchasing DayQuil for daytime symptoms and NyQuil for nighttime symptoms.  Please push fluids to include body armor, Pedialyte, Gatorade.  Please eat a low-fat high-protein diet. Please begin taking Zofran every 6 hours as needed for nausea and vomiting Please read attached guide concerning influenza Please wear a mask while in public settings

## 2022-09-27 NOTE — ED Provider Notes (Signed)
[facility] DEPT Provider Note   CSN: [id number] Arrival date & time: 09/27/22  0520     History  Chief Complaint  Patient presents with   Fever   Chills   Emesis    [name] is a 61 y.o. female with medical history of anemia, asthma, hypertension.  Patient presents to ED for evaluation of generalized bodyaches and chills.  Patient reports that beginning on Saturday she developed generalized bodyaches and chills, fevers, nausea and vomiting, sore throat.  Patient reports that the symptoms have been persisting since this time, she denies taking any OTC medications for her symptoms.  Patient reports that she "does not like taking medications" and this is the reason she has not taken anything for her symptoms.  Patient unsure of sick contacts.  Patient denies any chest pain, shortness of breath, diarrhea, lightheadedness, dizziness, trouble swallowing.   Fever Associated symptoms: nausea, sore throat and vomiting   Associated symptoms: no chest pain and no diarrhea   Emesis Associated symptoms: fever and sore throat   Associated symptoms: no abdominal pain and no diarrhea        Home Medications Prior to Admission medications   Medication Sig Start Date End Date Taking? Authorizing Provider  ondansetron (ZOFRAN) 4 MG tablet Take 1 tablet (4 mg total) by mouth every 6 (six) hours as needed for nausea or vomiting. 09/27/22  Yes Azucena Cecil, PA-C  albuterol (PROAIR HFA) 108 (90 Base) MCG/ACT inhaler Inhale 1-2 puffs into the lungs every 6 (six) hours as needed for wheezing or shortness of breath. 12/13/20   Glendale Chard, MD  buPROPion (WELLBUTRIN XL) 150 MG 24 hr tablet Take 1 tablet (150 mg total) by mouth daily. Patient not taking: Reported on 10/11/2020 08/10/20   Minette Brine, FNP  chlorthalidone (HYGROTON) 25 MG tablet TAKE 1 TABLET EVERY DAY 08/03/22   Elwin Mocha, MD  cholecalciferol (VITAMIN D3) 25 MCG (1000 UNIT) tablet Take  1,000 Units by mouth daily.    [provider]  ferrous sulfate (FER-IN-SOL) 75 (15 Fe) MG/ML SOLN Take by mouth.    [provider]  ibuprofen (ADVIL) 200 MG tablet Take 200 mg by mouth every 6 (six) hours as needed. Patient not taking: Reported on 04/30/2022    [provider]  Multiple Vitamins-Minerals (ONE-A-DAY WOMENS 50 PLUS PO) Take by mouth.    [provider]  NON FORMULARY cbd oil    [provider]      Allergies    Patient has no known allergies.    Review of Systems   Review of Systems  Constitutional:  Positive for fever.  HENT:  Positive for sore throat. Negative for trouble swallowing.   Respiratory:  Negative for shortness of breath.   Cardiovascular:  Negative for chest pain.  Gastrointestinal:  Positive for nausea and vomiting. Negative for abdominal pain and diarrhea.  Neurological:  Negative for dizziness, weakness and light-headedness.  All other systems reviewed and are negative.   Physical Exam Updated Vital Signs BP 129/65 (BP Location: Left Arm)   Pulse 88   Temp 99 F (37.2 C) (Oral)   Resp 19   Ht '5\' 4"'$  (1.626 m)   Wt 108.9 kg   LMP 03/26/2012   SpO2 97%   BMI 41.20 kg/m  Physical Exam Vitals and nursing note reviewed.  Constitutional:      General: She is not in acute distress.    Appearance: Normal appearance. She is not ill-appearing, toxic-appearing  or diaphoretic.  HENT:     Head: Normocephalic and atraumatic.     Nose: Nose normal. No congestion.     Mouth/Throat:     Mouth: Mucous membranes are moist.     Pharynx: Oropharynx is clear. Posterior oropharyngeal erythema present. No oropharyngeal exudate.  Eyes:     Extraocular Movements: Extraocular movements intact.     Conjunctiva/sclera: Conjunctivae normal.     Pupils: Pupils are equal, round, and reactive to light.  Cardiovascular:     Rate and Rhythm: Normal rate and regular rhythm.  Pulmonary:     Effort: Pulmonary effort is  normal.     Breath sounds: Normal breath sounds. No wheezing, rhonchi or rales.  Chest:     Chest wall: No tenderness.  Abdominal:     General: Abdomen is flat. Bowel sounds are normal.     Palpations: Abdomen is soft.     Tenderness: There is no abdominal tenderness.  Musculoskeletal:     Cervical back: Normal range of motion and neck supple. No tenderness.  Skin:    General: Skin is warm and dry.     Capillary Refill: Capillary refill takes less than 2 seconds.  Neurological:     Mental Status: She is alert and oriented to person, place, and time.     ED Results / Procedures / Treatments   Labs (all labs ordered are listed, but only abnormal results are displayed) Labs Reviewed  RESP PANEL BY RT-PCR (RSV, FLU A&B, COVID)  RVPGX2 - Abnormal; Notable for the following components:      Result Value   Influenza A by PCR POSITIVE (*)    All other components within normal limits    EKG None  Radiology No results found.  Procedures Procedures   Medications Ordered in ED Medications - No data to display  ED Course/ Medical Decision Making/ A&P                           Medical Decision Making  61 year old female presents to ED for generalized bodyaches.  Please see HPI for further details.  On my examination the patient is afebrile, nontachycardic.  Patient lung sounds are clear bilaterally, she is not hypoxic on room air.  Patient abdomen soft and compressible throughout.  Patient neurological examination shows no focal neurodeficits, patient alert and oriented x 3.  Patient has posterior oropharynx erythema without exudate.  Patient uvula midline.  No sign of RPA, PTA.  Patient handling secretions appropriately, no palpitated to voice.  Patient positive for influenza A.  Patient outside of 48-hour window for Tamiflu.  Patient will be advised to treat symptoms at home utilizing ibuprofen, Tylenol, over-the-counter medications.  Patient advised to push fluids, eat low-fat  high-protein diet.  Patient will be sent home with Zofran for nausea and vomiting.  Patient advised to return to the ED with any new or worsening signs or symptoms such as wheezing, shortness of breath.  Patient voiced understanding.  Patient had all her questions answered prior to discharge.  Return precautions provided.  Patient stable for discharge at this time.  Final Clinical Impression(s) / ED Diagnoses Final diagnoses:  Influenza A    Rx / DC Orders ED Discharge Orders          Ordered    ondansetron (ZOFRAN) 4 MG tablet  Every 6 hours PRN        09/27/22 0730  Azucena Cecil, PA-C 09/27/22 [id number]    Davonna Belling, MD 09/27/22 [phone]

## 2022-10-27 IMAGING — MG MM DIGITAL SCREENING BILAT W/ TOMO AND CAD
6 of 10 series · 6 of 30 positions shown · non-contrast
Comparison: Previous exam(s).

CLINICAL DATA: Screening.

EXAM:
DIGITAL SCREENING BILATERAL MAMMOGRAM WITH TOMOSYNTHESIS AND CAD
TECHNIQUE: Bilateral screening digital craniocaudal and mediolateral oblique
mammograms were obtained. Bilateral screening digital breast
tomosynthesis was performed. The images were evaluated with
computer-aided detection.

[L MLO synth-2D]
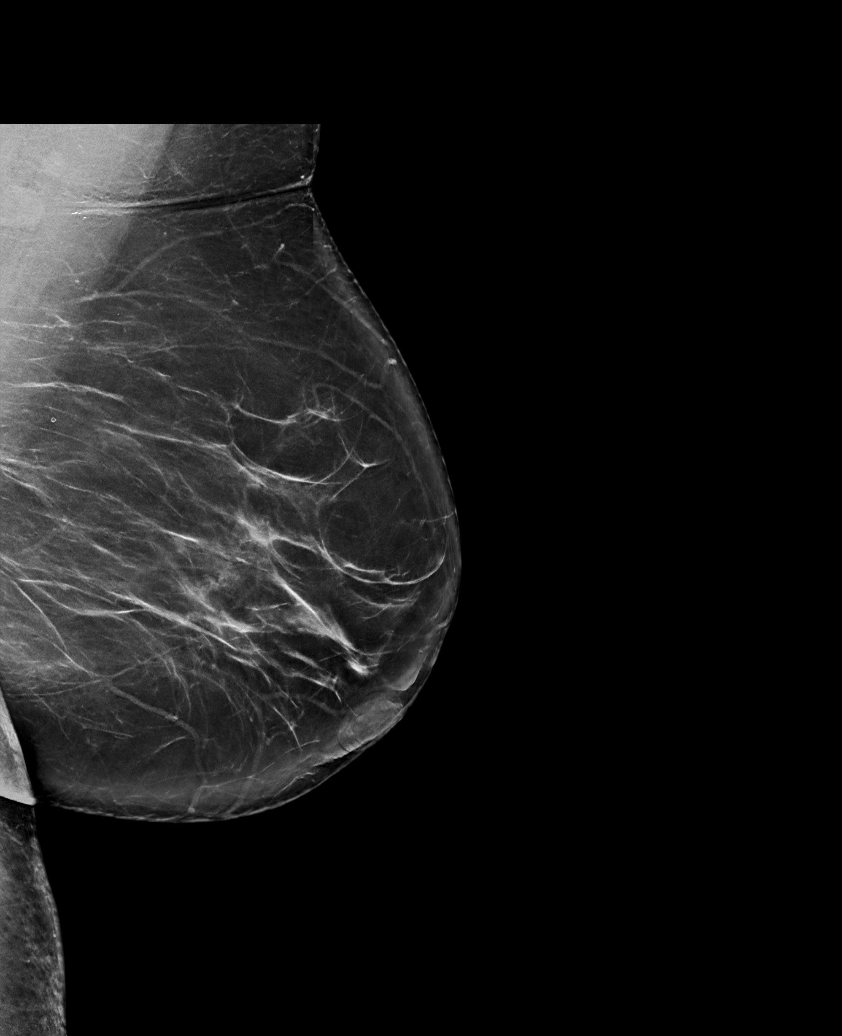

[R CC synth-2D]
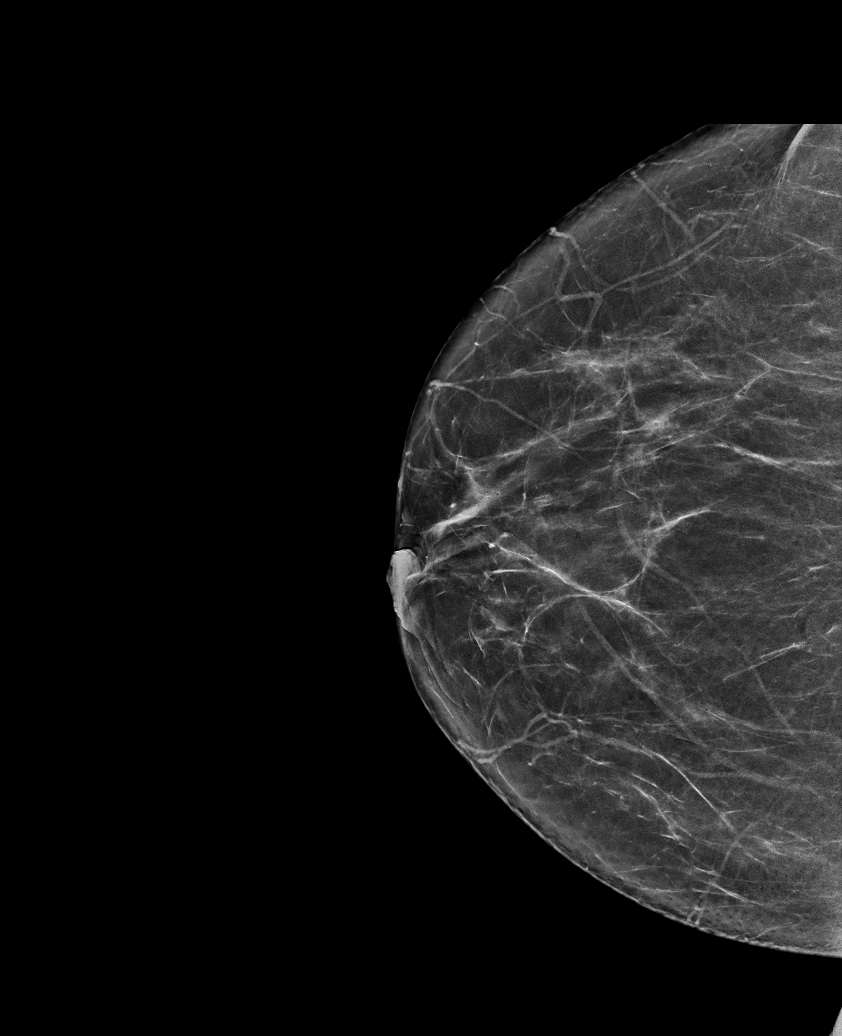

[L CC synth-2D (1 of 2)]
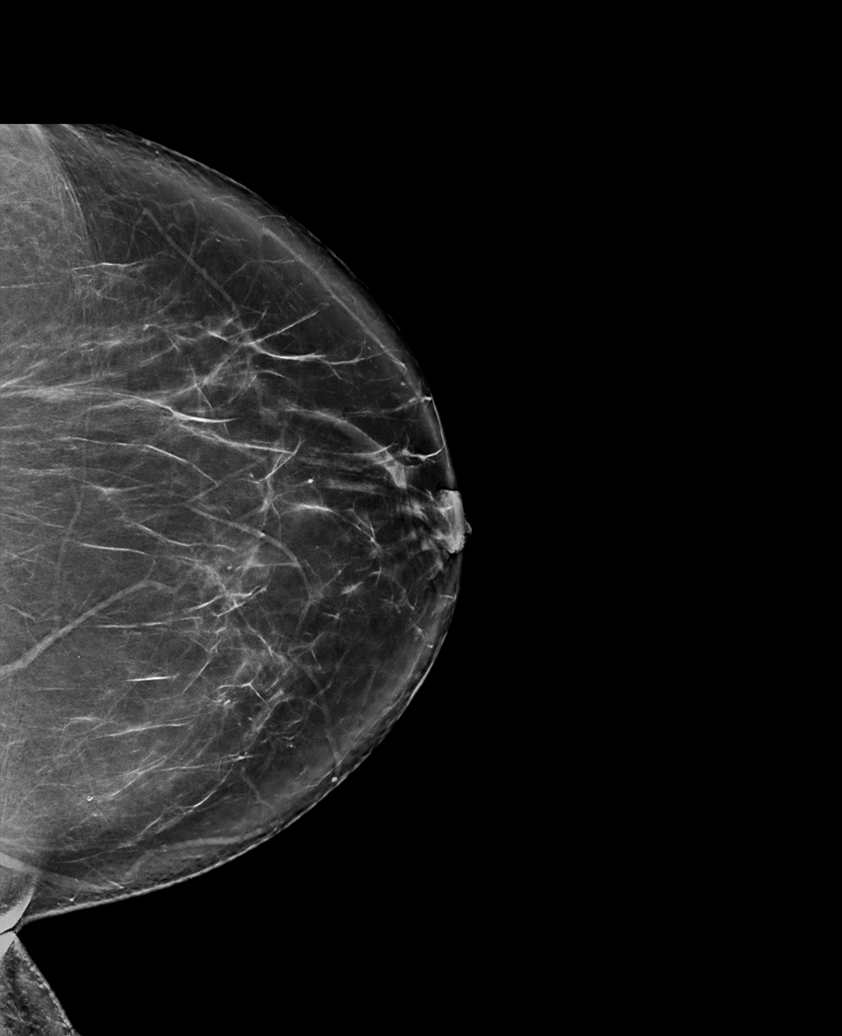

[R MLO synth-2D]
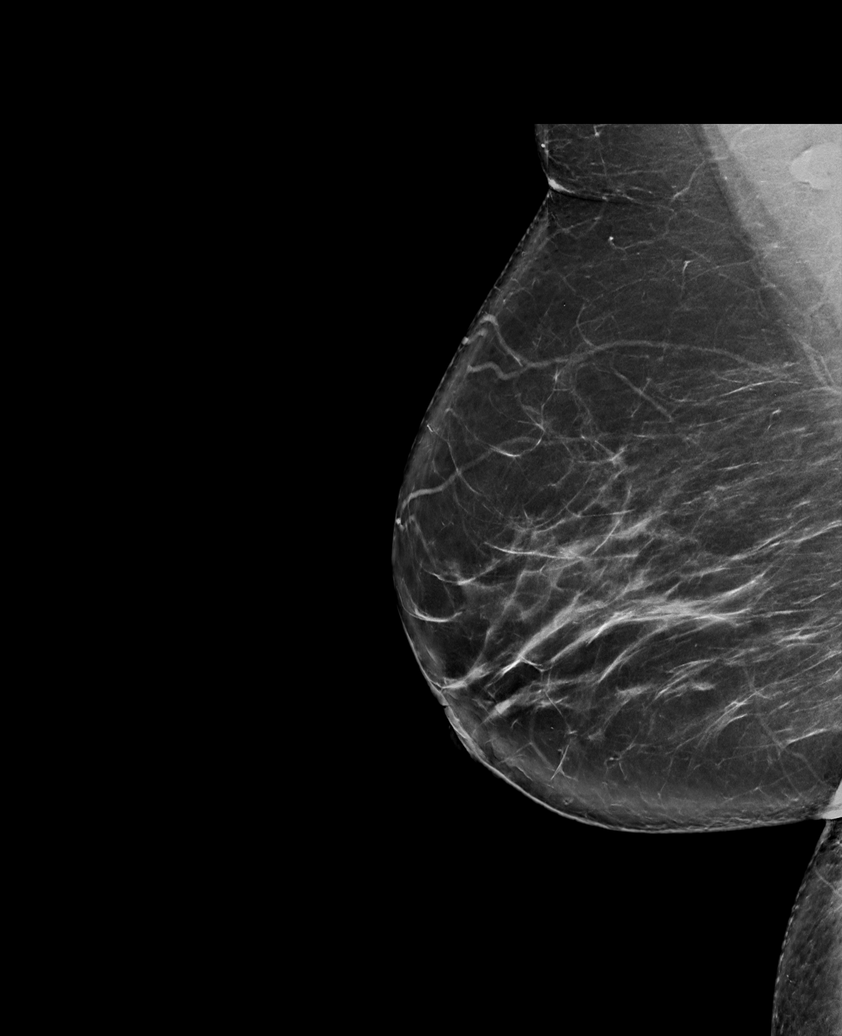

[L CC synth-2D (2 of 2)]
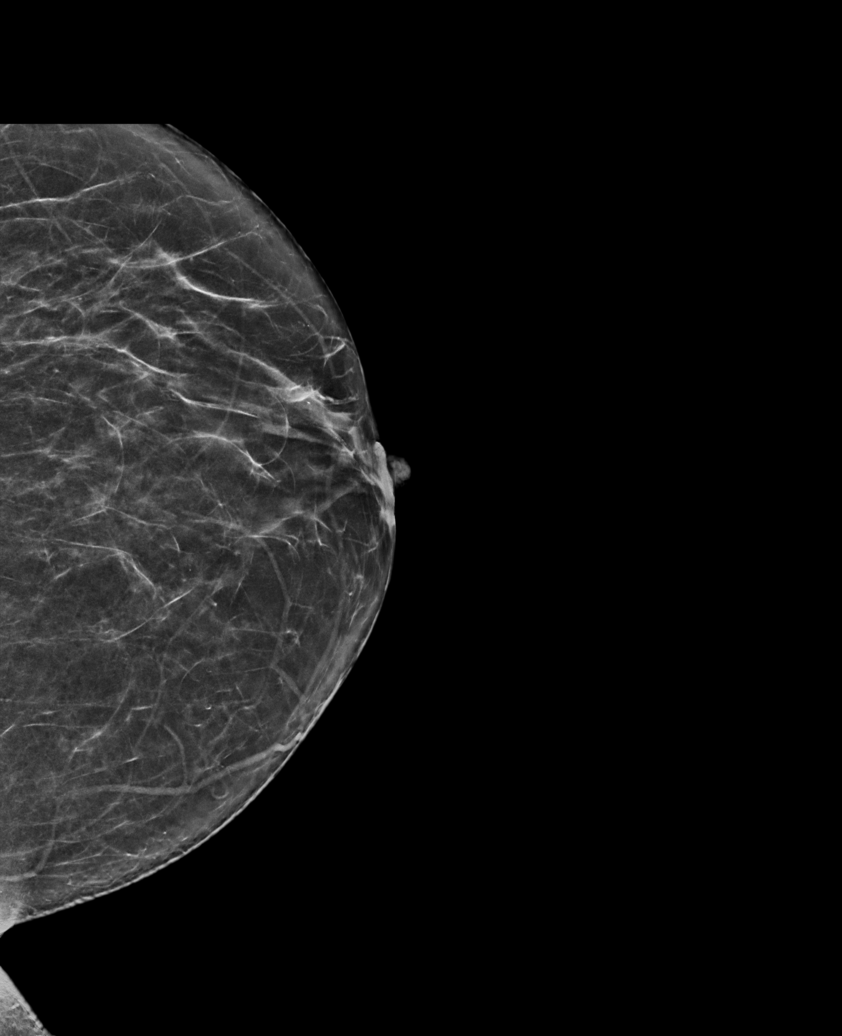

[R MLO tomo · tomo slice 45/89.0]
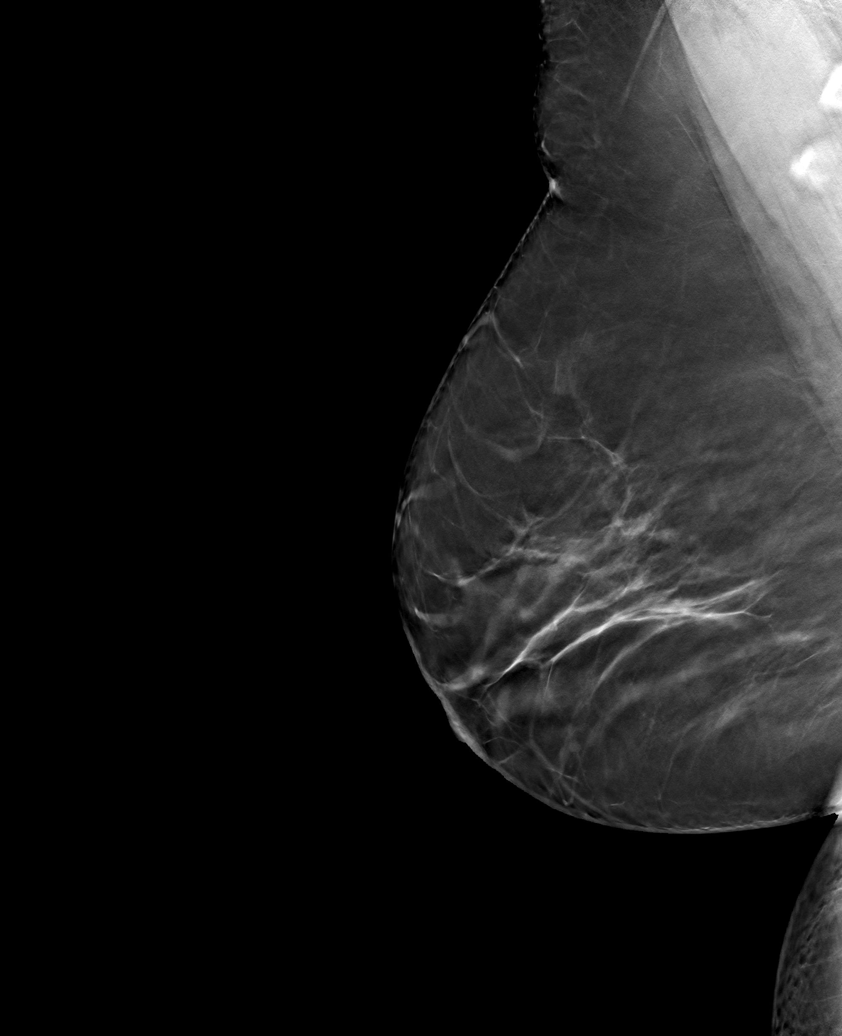

[6 of 30 positions shown; findings below may reference images not displayed]

ACR Breast Density Category b: There are scattered areas of
fibroglandular density.
FINDINGS: There are no findings suspicious for malignancy.
IMPRESSION: No mammographic evidence of malignancy. A result letter of this
screening mammogram will be mailed directly to the patient.

RECOMMENDATION:
Screening mammogram in one year. (Code:51-O-LD2)

BI-RADS CATEGORY  1: Negative.

## 2024-10-17 ENCOUNTER — Emergency Department (HOSPITAL_COMMUNITY)
Admission: EM | Admit: 2024-10-17 | Discharge: 2024-10-17 | Disposition: A | Discharge status: Home / Self Care | Payer: Self-pay | Attending: Emergency Medicine | Admitting: Emergency Medicine

## 2024-10-17 ENCOUNTER — Emergency Department (HOSPITAL_COMMUNITY): Payer: Self-pay

## 2024-10-17 DIAGNOSIS — M9963 Osseous and subluxation stenosis of intervertebral foramina of lumbar region: Secondary | ICD-10-CM | POA: Insufficient documentation

## 2024-10-17 DIAGNOSIS — M5416 Radiculopathy, lumbar region: Secondary | ICD-10-CM | POA: Insufficient documentation

## 2024-10-17 DIAGNOSIS — M48061 Spinal stenosis, lumbar region without neurogenic claudication: Secondary | ICD-10-CM

## 2024-10-17 MED ORDER — HYDROCODONE-ACETAMINOPHEN 5-325 MG PO TABS
2.0000 | ORAL_TABLET | Freq: Once | ORAL | Status: AC
  Administered 2024-10-17: 2 via ORAL
  Filled 2024-10-17: qty 2

## 2024-10-17 MED ORDER — DICLOFENAC SODIUM 75 MG PO TBEC: 75.0000 mg | DELAYED_RELEASE_TABLET | Freq: Two times a day (BID) | ORAL | 1 refills | Status: AC

## 2024-10-17 MED ORDER — HYDROCODONE-ACETAMINOPHEN 5-325 MG PO TABS: 1.0000 | ORAL_TABLET | ORAL | 0 refills | Status: AC | PRN

## 2024-10-17 NOTE — ED Notes (Signed)
 Patient back from CT

## 2024-10-17 NOTE — ED Triage Notes (Signed)
 Pt reports right hip x 2 days, denies injury or trauma to area, pt reports pain was on awakening, throbbing in nature, pain is causing her not to be able to walk , radiating from hip down to knee

## 2024-10-17 NOTE — Discharge Instructions (Signed)
 As discussed, if you have not heard from the neurosurgeon by Tuesday midday, I would contact them to set up an appointment for follow-up.  If you begin to have any loss of control of your bowel or bladder function, start to notice numbness in the groin area, or any other concerning findings prior to your assessment, I would highly recommend you come back to the emergency room for further assessment.

## 2024-10-17 NOTE — ED Provider Notes (Signed)
 " [place] EMERGENCY DEPARTMENT AT [facility] Provider Note   CSN: [id number] Arrival date & time: 10/17/24  1117     Patient presents with: Hip Pain   [name] is a 64 y.o. female who presents to the ED today with primary concern of right sided lumbar pain that radiates down the right lower extremity, primarily on the posterior aspect through the thigh, popliteal fossa, and the right calf.  Denies any distal numbness or paresthesia, denies any saddle anesthesia, denies any bowel or bladder incontinence.  There is no previous injury, she states that she has previously had back pain and received corticosteroid injections but this was a significant amount of time ago and has not had any recurrent events until 2 days prior.  She was at a [employer] when she was getting up from her chair, felt that she may have strained her lower back at the time.  Has been using over-the-counter medications for pain without effect.    Hip Pain       Prior to Admission medications  Medication Sig Start Date End Date Taking? Authorizing Provider  diclofenac  (VOLTAREN ) 75 MG EC tablet Take 1 tablet (75 mg total) by mouth 2 (two) times daily. 10/17/24  Yes Myriam Dorn BROCKS, PA  HYDROcodone -acetaminophen  (NORCO/VICODIN) 5-325 MG tablet Take 1-2 tablets by mouth every 4 (four) hours as needed for up to 5 days for severe pain (pain score 7-10) (If not well-controlled with prescribed anti-inflammatories). 10/17/24 10/22/24 Yes Myriam Dorn BROCKS, PA  albuterol  (PROAIR  HFA) 108 (90 Base) MCG/ACT inhaler Inhale 1-2 puffs into the lungs every 6 (six) hours as needed for wheezing or shortness of breath. 12/13/20   Jarold Medici, MD  buPROPion  (WELLBUTRIN  XL) 150 MG 24 hr tablet Take 1 tablet (150 mg total) by mouth daily. Patient not taking: Reported on 10/11/2020 08/10/20   Georgina Speaks, FNP  chlorthalidone  (HYGROTON ) 25 MG tablet TAKE 1 TABLET EVERY DAY 08/03/22   Alen Manus HERO, MD   cholecalciferol (VITAMIN D3) 25 MCG (1000 UNIT) tablet Take 1,000 Units by mouth daily.    [provider]  ferrous sulfate (FER-IN-SOL) 75 (15 Fe) MG/ML SOLN Take by mouth.    [provider]  ibuprofen (ADVIL) 200 MG tablet Take 200 mg by mouth every 6 (six) hours as needed. Patient not taking: Reported on 04/30/2022    [provider]  Multiple Vitamins-Minerals (ONE-A-DAY WOMENS 50 PLUS PO) Take by mouth.    [provider]  NON FORMULARY cbd oil    [provider]  ondansetron  (ZOFRAN ) 4 MG tablet Take 1 tablet (4 mg total) by mouth every 6 (six) hours as needed for nausea or vomiting. 09/27/22   Ruthell Lonni FALCON, PA-C    Allergies: Patient has no known allergies.    Review of Systems  Musculoskeletal:  Positive for back pain and myalgias.  All other systems reviewed and are negative.   Updated Vital Signs BP (!) 178/97   Pulse 75   Temp (!) 97.5 F (36.4 C) (Oral)   Resp 20   LMP 04/19/2012   SpO2 96%   Physical Exam Vitals and nursing note reviewed.  Constitutional:      General: She is not in acute distress.    Appearance: Normal appearance.  HENT:     Head: Normocephalic and atraumatic.     Mouth/Throat:     Mouth: Mucous membranes are moist.     Pharynx: Oropharynx is clear.  Eyes:  Extraocular Movements: Extraocular movements intact.     Conjunctiva/sclera: Conjunctivae normal.     Pupils: Pupils are equal, round, and reactive to light.  Cardiovascular:     Rate and Rhythm: Normal rate and regular rhythm.     Pulses: Normal pulses.     Heart sounds: Normal heart sounds. No murmur heard.    No friction rub. No gallop.  Pulmonary:     Effort: Pulmonary effort is normal.     Breath sounds: Normal breath sounds.  Abdominal:     General: Abdomen is flat. Bowel sounds are normal.     Palpations: Abdomen is soft.  Musculoskeletal:     Cervical back: Normal, normal range of motion and neck supple.     Thoracic  back: Normal.     Lumbar back: Tenderness present. Positive right straight leg raise test.     Right hip: Tenderness present.     Left hip: Normal.     Right lower leg: No edema.     Left lower leg: No edema.     Comments: There is tenderness and pain with palpation of the right greater trochanter/trochanteric bursa.  Noted spinal tenderness at level of L4/L5/lumbosacral junction.  Skin:    General: Skin is warm and dry.     Capillary Refill: Capillary refill takes less than 2 seconds.  Neurological:     General: No focal deficit present.     Mental Status: She is alert and oriented to person, place, and time. Mental status is at baseline.     GCS: GCS eye subscore is 4. GCS verbal subscore is 5. GCS motor subscore is 6.     Comments: 5/5 strength in all 4 extremities, no focal sensory or motor deficits appreciated.  Psychiatric:        Mood and Affect: Mood normal.     (all labs ordered are listed, but only abnormal results are displayed) Labs Reviewed - No data to display  EKG: None  Radiology: CT Lumbar Spine Wo Contrast Result Date: 10/17/2024 EXAM: CT OF THE LUMBAR SPINE WITHOUT CONTRAST 10/17/2024 11:51:00 AM TECHNIQUE: CT of the lumbar spine was performed without the administration of intravenous contrast. Multiplanar reformatted images are provided for review. Automated exposure control, iterative reconstruction, and/or weight based adjustment of the mA/kV was utilized to reduce the radiation dose to as low as reasonably achievable. COMPARISON: None available. CLINICAL HISTORY: Lumbar radiculopathy, no red flags, no prior management. FINDINGS: BONES AND ALIGNMENT: Normal vertebral body heights. No acute fracture or suspicious bone lesion. Normal alignment except for grade 1 anterolisthesis at L4-L5 measuring 6 mm and slight degenerative anterolisthesis at L5-S1. DEGENERATIVE CHANGES: * **L1-L2:** Mild facet hypertrophy and mild disc bulging. No significant stenosis is present. *    **L2-L3:** Mild facet hypertrophy and mild disc bulging. No significant stenosis is present. *   **L3-L4:** A leftward disc protrusion is present. Mild left foraminal narrowing is present. *   **L4-L5:** Uncovering of a broad-based disc protrusion and advanced facet hypertrophy. Moderate right and mild left foraminal stenosis is present. *   **L5-S1:** A central calcified disc protrusion is present. Facet spurring contributes to mild right foraminal stenosis. SOFT TISSUES: No acute abnormality. IMPRESSION: 1. Broad-based disc protrusion and advanced facet hypertrophy at L4-5 with moderate right and mild left foraminal stenosis. 2. Grade 1 anterolisthesis at L4-5 measures 6 mm. 3. Leftward disc protrusion at L3-4 with mild left foraminal narrowing. 4. Central calcified disc protrusion at L5-S1 with mild right foraminal stenosis due  to facet spurring. 5. Slight degenerative anterolisthesis at L5-S1. Electronically signed by: Lonni Necessary MD 10/17/2024 12:01 PM EST RP Workstation: [id number]     Procedures   Medications Ordered in the ED  HYDROcodone -acetaminophen  (NORCO/VICODIN) 5-325 MG per tablet 2 tablet (2 tablets Oral Given 10/17/24 1137)                                    Medical Decision Making Amount and/or Complexity of Data Reviewed Radiology: ordered.  Risk Prescription drug management.   Medical Decision Making:   [name] is a 64 y.o. female who presented to the ED today with lower back pain as well as radicular right lower extremity pain detailed above.    External chart has been reviewed including previous imaging. Complete initial physical exam performed, notably the patient  was alert and oriented in no apparent distress.  She does have pain with palpation over the right greater trochanter/trochanteric bursa, tenderness in the distribution of the gluteus medius on the right..    Reviewed and confirmed nursing documentation for past medical history, family  history, social history.    Initial Assessment:   With the patient's presentation of lower back pain and radicular right lower limb pain, consider possible piriformis syndrome, lumbar radiculopathy, lumbar stenosis, spondyloarthropathy.  Also given physical exam consider possible trochanteric bursitis.  Initial Plan:  CT imaging of the lumbar spine to evaluate for spinal stenosis and/or spondyloarthropathy. Objective evaluation as below reviewed   Initial Study Results:    Radiology:  All images reviewed independently. Agree with radiology report at this time.   CT Lumbar Spine Wo Contrast Result Date: 10/17/2024 EXAM: CT OF THE LUMBAR SPINE WITHOUT CONTRAST 10/17/2024 11:51:00 AM TECHNIQUE: CT of the lumbar spine was performed without the administration of intravenous contrast. Multiplanar reformatted images are provided for review. Automated exposure control, iterative reconstruction, and/or weight based adjustment of the mA/kV was utilized to reduce the radiation dose to as low as reasonably achievable. COMPARISON: None available. CLINICAL HISTORY: Lumbar radiculopathy, no red flags, no prior management. FINDINGS: BONES AND ALIGNMENT: Normal vertebral body heights. No acute fracture or suspicious bone lesion. Normal alignment except for grade 1 anterolisthesis at L4-L5 measuring 6 mm and slight degenerative anterolisthesis at L5-S1. DEGENERATIVE CHANGES: * **L1-L2:** Mild facet hypertrophy and mild disc bulging. No significant stenosis is present. *   **L2-L3:** Mild facet hypertrophy and mild disc bulging. No significant stenosis is present. *   **L3-L4:** A leftward disc protrusion is present. Mild left foraminal narrowing is present. *   **L4-L5:** Uncovering of a broad-based disc protrusion and advanced facet hypertrophy. Moderate right and mild left foraminal stenosis is present. *   **L5-S1:** A central calcified disc protrusion is present. Facet spurring contributes to mild right foraminal  stenosis. SOFT TISSUES: No acute abnormality. IMPRESSION: 1. Broad-based disc protrusion and advanced facet hypertrophy at L4-5 with moderate right and mild left foraminal stenosis. 2. Grade 1 anterolisthesis at L4-5 measures 6 mm. 3. Leftward disc protrusion at L3-4 with mild left foraminal narrowing. 4. Central calcified disc protrusion at L5-S1 with mild right foraminal stenosis due to facet spurring. 5. Slight degenerative anterolisthesis at L5-S1. Electronically signed by: Lonni Necessary MD 10/17/2024 12:01 PM EST RP Workstation: [id number]     Reassessment and Plan:   CT imaging does demonstrate disc protrusion through L4-L5, grade 1 anterolisthesis of L4-L5, and calcified disc protrusion at L5-S1 coinciding with physical exam  findings.  Given this, believe this is sciatica that is coming from the foraminal stenosis noted on the CT imaging, as this is impinging on the right side, likely causing nerve root compression.  Also consider worsening of symptoms due to her recent events, increasing muscular edema creating increased pain.  Will refer for neurosurgery evaluation given the CT findings on study done today, and will also prescribe course of diclofenac  for anti-inflammatory pain control, as well as as needed hydrocodone  for breakthrough pain.  Discussed this thoroughly with the patient along with careful return precautions regarding worsening signs or symptoms including red flag of cauda equina syndrome.  She understands and agrees has no further concerns at this time, as she has no red flag symptoms at present, and the pain is reduced with medications provided, will discharge with outpatient follow-up as noted.       Final diagnoses:  Lumbar radiculopathy, acute  Lumbar foraminal stenosis    ED Discharge Orders          Ordered    Ambulatory referral to Neurosurgery       Comments: Please Select To Department: [facility] for Nerve or Spine  Please select To Department:  [facility] AT [place] for Cranial or Neurovascular   10/17/24 1230    diclofenac  (VOLTAREN ) 75 MG EC tablet  2 times daily        10/17/24 1230    HYDROcodone -acetaminophen  (NORCO/VICODIN) 5-325 MG tablet  Every 4 hours PRN        10/17/24 1230               Myriam Dorn BROCKS, [place] 10/17/24 1441  "

## 2024-11-25 ENCOUNTER — Ambulatory Visit: Payer: Self-pay | Admitting: Orthopedic Surgery
